# Patient Record
Sex: Male | Born: 1935 | Race: White | Hispanic: No | Marital: Married | Smoking: Former smoker
Health system: Southern US, Community
[De-identification: ages and names within clinical notes are randomized; demographics above are authoritative.]

## PROBLEM LIST (undated history)

## (undated) DIAGNOSIS — I272 Pulmonary hypertension, unspecified: Secondary | ICD-10-CM

## (undated) DIAGNOSIS — Z8679 Personal history of other diseases of the circulatory system: Secondary | ICD-10-CM

## (undated) DIAGNOSIS — F0390 Unspecified dementia without behavioral disturbance: Secondary | ICD-10-CM

## (undated) DIAGNOSIS — Z9289 Personal history of other medical treatment: Secondary | ICD-10-CM

## (undated) DIAGNOSIS — I495 Sick sinus syndrome: Secondary | ICD-10-CM

## (undated) DIAGNOSIS — E785 Hyperlipidemia, unspecified: Secondary | ICD-10-CM

## (undated) DIAGNOSIS — I951 Orthostatic hypotension: Secondary | ICD-10-CM

## (undated) DIAGNOSIS — R55 Syncope and collapse: Secondary | ICD-10-CM

## (undated) DIAGNOSIS — Z9989 Dependence on other enabling machines and devices: Secondary | ICD-10-CM

## (undated) DIAGNOSIS — D649 Anemia, unspecified: Secondary | ICD-10-CM

## (undated) DIAGNOSIS — I4891 Unspecified atrial fibrillation: Secondary | ICD-10-CM

## (undated) DIAGNOSIS — I38 Endocarditis, valve unspecified: Secondary | ICD-10-CM

## (undated) DIAGNOSIS — I350 Nonrheumatic aortic (valve) stenosis: Secondary | ICD-10-CM

## (undated) DIAGNOSIS — I10 Essential (primary) hypertension: Secondary | ICD-10-CM

## (undated) DIAGNOSIS — G4733 Obstructive sleep apnea (adult) (pediatric): Secondary | ICD-10-CM

## (undated) DIAGNOSIS — I5032 Chronic diastolic (congestive) heart failure: Secondary | ICD-10-CM

## (undated) DIAGNOSIS — K922 Gastrointestinal hemorrhage, unspecified: Secondary | ICD-10-CM

## (undated) DIAGNOSIS — Z950 Presence of cardiac pacemaker: Secondary | ICD-10-CM

## (undated) DIAGNOSIS — I509 Heart failure, unspecified: Secondary | ICD-10-CM

## (undated) DIAGNOSIS — E782 Mixed hyperlipidemia: Secondary | ICD-10-CM

## (undated) HISTORY — DX: Hyperlipidemia, unspecified: E78.5

## (undated) HISTORY — DX: Anemia, unspecified: D64.9

## (undated) HISTORY — DX: Mixed hyperlipidemia: E78.2

## (undated) HISTORY — DX: Pulmonary hypertension, unspecified: I27.20

## (undated) HISTORY — DX: Orthostatic hypotension: I95.1

## (undated) HISTORY — DX: Unspecified dementia, unspecified severity, without behavioral disturbance, psychotic disturbance, mood disturbance, and anxiety: F03.90

## (undated) HISTORY — DX: Sick sinus syndrome: I49.5

## (undated) HISTORY — DX: Obstructive sleep apnea (adult) (pediatric): G47.33

## (undated) HISTORY — DX: Unspecified atrial fibrillation: I48.91

## (undated) HISTORY — DX: Personal history of other diseases of the circulatory system: Z86.79

## (undated) HISTORY — DX: Nonrheumatic aortic (valve) stenosis: I35.0

## (undated) HISTORY — DX: Dependence on other enabling machines and devices: Z99.89

## (undated) HISTORY — DX: Personal history of other medical treatment: Z92.89

## (undated) HISTORY — DX: Gastrointestinal hemorrhage, unspecified: K92.2

## (undated) HISTORY — DX: Chronic diastolic (congestive) heart failure: I50.32

## (undated) HISTORY — DX: Endocarditis, valve unspecified: I38

## (undated) HISTORY — DX: Presence of cardiac pacemaker: Z95.0

---

## 2004-06-20 ENCOUNTER — Encounter: Payer: Self-pay | Admitting: Cardiology

## 2004-07-22 ENCOUNTER — Encounter: Payer: Self-pay | Admitting: Cardiology

## 2004-08-13 ENCOUNTER — Ambulatory Visit: Payer: Self-pay | Admitting: Cardiology

## 2004-09-11 ENCOUNTER — Ambulatory Visit: Payer: Self-pay | Admitting: Cardiology

## 2004-09-24 ENCOUNTER — Ambulatory Visit: Payer: Self-pay | Admitting: Physician Assistant

## 2004-10-09 ENCOUNTER — Ambulatory Visit: Payer: Self-pay | Admitting: Cardiology

## 2004-11-06 ENCOUNTER — Ambulatory Visit: Payer: Self-pay | Admitting: Cardiology

## 2004-11-13 ENCOUNTER — Ambulatory Visit: Payer: Self-pay | Admitting: Cardiology

## 2004-12-11 ENCOUNTER — Ambulatory Visit: Payer: Self-pay | Admitting: Cardiology

## 2004-12-18 ENCOUNTER — Ambulatory Visit: Payer: Self-pay | Admitting: Cardiology

## 2005-01-08 ENCOUNTER — Ambulatory Visit: Payer: Self-pay | Admitting: Internal Medicine

## 2005-02-05 ENCOUNTER — Ambulatory Visit: Payer: Self-pay | Admitting: Cardiology

## 2005-03-08 ENCOUNTER — Ambulatory Visit: Payer: Self-pay | Admitting: Cardiology

## 2005-03-15 ENCOUNTER — Ambulatory Visit: Payer: Self-pay | Admitting: Cardiology

## 2005-03-29 ENCOUNTER — Ambulatory Visit: Payer: Self-pay | Admitting: Internal Medicine

## 2005-04-05 ENCOUNTER — Ambulatory Visit: Payer: Self-pay | Admitting: Cardiology

## 2005-04-22 ENCOUNTER — Ambulatory Visit: Payer: Self-pay | Admitting: Cardiology

## 2005-04-29 ENCOUNTER — Ambulatory Visit: Payer: Self-pay | Admitting: Cardiology

## 2005-05-06 ENCOUNTER — Ambulatory Visit: Payer: Self-pay | Admitting: Cardiology

## 2005-05-20 ENCOUNTER — Ambulatory Visit: Payer: Self-pay | Admitting: Cardiology

## 2005-06-17 ENCOUNTER — Ambulatory Visit: Payer: Self-pay | Admitting: Cardiology

## 2005-07-15 ENCOUNTER — Ambulatory Visit: Payer: Self-pay | Admitting: Cardiology

## 2005-07-23 ENCOUNTER — Ambulatory Visit: Payer: Self-pay | Admitting: Cardiology

## 2005-07-30 ENCOUNTER — Ambulatory Visit: Payer: Self-pay | Admitting: Cardiology

## 2005-08-06 ENCOUNTER — Ambulatory Visit: Payer: Self-pay | Admitting: Cardiology

## 2005-08-14 ENCOUNTER — Ambulatory Visit: Payer: Self-pay | Admitting: Cardiology

## 2005-08-26 ENCOUNTER — Ambulatory Visit: Payer: Self-pay | Admitting: Cardiology

## 2005-09-02 ENCOUNTER — Ambulatory Visit: Payer: Self-pay | Admitting: Internal Medicine

## 2005-09-24 ENCOUNTER — Ambulatory Visit: Payer: Self-pay | Admitting: Cardiology

## 2005-10-01 ENCOUNTER — Ambulatory Visit: Payer: Self-pay | Admitting: Cardiology

## 2005-10-08 ENCOUNTER — Ambulatory Visit: Payer: Self-pay | Admitting: Cardiology

## 2005-10-15 ENCOUNTER — Ambulatory Visit: Payer: Self-pay | Admitting: Cardiology

## 2005-10-18 ENCOUNTER — Ambulatory Visit: Payer: Self-pay | Admitting: Cardiology

## 2005-10-22 ENCOUNTER — Ambulatory Visit: Payer: Self-pay

## 2005-11-15 ENCOUNTER — Ambulatory Visit: Payer: Self-pay | Admitting: Cardiology

## 2005-11-22 ENCOUNTER — Ambulatory Visit: Payer: Self-pay | Admitting: Cardiology

## 2005-11-29 ENCOUNTER — Ambulatory Visit: Payer: Self-pay | Admitting: Cardiology

## 2005-12-06 ENCOUNTER — Ambulatory Visit: Payer: Self-pay | Admitting: Cardiology

## 2005-12-20 ENCOUNTER — Ambulatory Visit: Payer: Self-pay | Admitting: Cardiology

## 2006-01-23 ENCOUNTER — Ambulatory Visit: Payer: Self-pay | Admitting: Cardiology

## 2006-02-20 ENCOUNTER — Ambulatory Visit: Payer: Self-pay | Admitting: Cardiology

## 2006-02-27 ENCOUNTER — Ambulatory Visit: Payer: Self-pay | Admitting: Cardiology

## 2006-04-03 ENCOUNTER — Ambulatory Visit: Payer: Self-pay | Admitting: Cardiology

## 2006-04-17 ENCOUNTER — Ambulatory Visit: Payer: Self-pay | Admitting: Cardiology

## 2006-05-01 ENCOUNTER — Ambulatory Visit: Payer: Self-pay | Admitting: Cardiology

## 2006-05-15 ENCOUNTER — Ambulatory Visit: Payer: Self-pay | Admitting: Cardiology

## 2006-06-05 ENCOUNTER — Ambulatory Visit: Payer: Self-pay | Admitting: Cardiology

## 2006-06-12 ENCOUNTER — Ambulatory Visit: Payer: Self-pay | Admitting: Cardiology

## 2006-06-19 ENCOUNTER — Ambulatory Visit: Payer: Self-pay | Admitting: Cardiology

## 2006-07-24 ENCOUNTER — Ambulatory Visit: Payer: Self-pay | Admitting: Cardiology

## 2006-08-26 ENCOUNTER — Ambulatory Visit: Payer: Self-pay | Admitting: Cardiology

## 2006-09-16 ENCOUNTER — Ambulatory Visit: Payer: Self-pay | Admitting: Cardiology

## 2006-10-17 ENCOUNTER — Ambulatory Visit: Payer: Self-pay | Admitting: Cardiology

## 2006-11-14 ENCOUNTER — Ambulatory Visit: Payer: Self-pay | Admitting: Cardiology

## 2006-11-20 ENCOUNTER — Ambulatory Visit (HOSPITAL_COMMUNITY): Admission: RE | Admit: 2006-11-20 | Discharge: 2006-11-20 | Payer: Self-pay | Admitting: *Deleted

## 2006-11-21 ENCOUNTER — Ambulatory Visit: Payer: Self-pay | Admitting: Cardiology

## 2006-12-04 ENCOUNTER — Ambulatory Visit: Payer: Self-pay | Admitting: Cardiology

## 2006-12-19 ENCOUNTER — Ambulatory Visit: Payer: Self-pay | Admitting: Cardiology

## 2006-12-29 ENCOUNTER — Ambulatory Visit: Payer: Self-pay | Admitting: Cardiology

## 2007-01-12 ENCOUNTER — Ambulatory Visit: Payer: Self-pay | Admitting: Cardiology

## 2007-02-11 ENCOUNTER — Ambulatory Visit: Payer: Self-pay | Admitting: Cardiology

## 2007-03-02 ENCOUNTER — Ambulatory Visit: Payer: Self-pay | Admitting: Cardiology

## 2007-03-24 ENCOUNTER — Ambulatory Visit: Payer: Self-pay | Admitting: Cardiology

## 2007-03-26 ENCOUNTER — Ambulatory Visit (HOSPITAL_COMMUNITY): Admission: AD | Admit: 2007-03-26 | Discharge: 2007-03-27 | Payer: Self-pay | Admitting: Internal Medicine

## 2007-03-26 ENCOUNTER — Ambulatory Visit: Payer: Self-pay | Admitting: Internal Medicine

## 2007-03-26 HISTORY — PX: OTHER SURGICAL HISTORY: SHX169

## 2007-04-13 ENCOUNTER — Ambulatory Visit: Payer: Self-pay

## 2007-04-27 ENCOUNTER — Ambulatory Visit: Payer: Self-pay | Admitting: Cardiology

## 2007-04-29 ENCOUNTER — Ambulatory Visit: Admission: RE | Admit: 2007-04-29 | Discharge: 2007-04-29 | Payer: Self-pay | Admitting: Pulmonary Disease

## 2007-04-29 ENCOUNTER — Ambulatory Visit: Payer: Self-pay | Admitting: Pulmonary Disease

## 2007-05-04 ENCOUNTER — Ambulatory Visit: Payer: Self-pay | Admitting: Cardiology

## 2007-05-20 ENCOUNTER — Ambulatory Visit: Payer: Self-pay | Admitting: Pulmonary Disease

## 2007-06-05 ENCOUNTER — Ambulatory Visit: Payer: Self-pay | Admitting: Cardiology

## 2007-06-26 ENCOUNTER — Ambulatory Visit: Payer: Self-pay | Admitting: Cardiology

## 2007-07-02 ENCOUNTER — Ambulatory Visit: Payer: Self-pay | Admitting: Cardiology

## 2007-07-09 ENCOUNTER — Ambulatory Visit: Payer: Self-pay | Admitting: Cardiology

## 2007-07-23 ENCOUNTER — Ambulatory Visit: Payer: Self-pay | Admitting: Cardiology

## 2007-07-27 ENCOUNTER — Ambulatory Visit: Payer: Self-pay | Admitting: Internal Medicine

## 2007-08-20 ENCOUNTER — Ambulatory Visit: Payer: Self-pay | Admitting: Cardiology

## 2007-09-02 ENCOUNTER — Ambulatory Visit: Payer: Self-pay | Admitting: Cardiology

## 2007-09-16 ENCOUNTER — Ambulatory Visit: Payer: Self-pay | Admitting: Cardiology

## 2007-10-07 ENCOUNTER — Ambulatory Visit: Payer: Self-pay | Admitting: Cardiology

## 2007-10-21 ENCOUNTER — Encounter: Payer: Self-pay | Admitting: Cardiology

## 2007-11-17 ENCOUNTER — Ambulatory Visit: Payer: Self-pay | Admitting: Cardiology

## 2007-11-23 ENCOUNTER — Ambulatory Visit: Payer: Self-pay | Admitting: Cardiology

## 2008-03-24 ENCOUNTER — Ambulatory Visit: Payer: Self-pay | Admitting: Internal Medicine

## 2008-05-11 ENCOUNTER — Encounter: Payer: Self-pay | Admitting: Physician Assistant

## 2008-05-11 ENCOUNTER — Ambulatory Visit: Payer: Self-pay | Admitting: Cardiology

## 2008-09-16 ENCOUNTER — Ambulatory Visit: Payer: Self-pay | Admitting: Internal Medicine

## 2008-10-31 ENCOUNTER — Encounter: Payer: Self-pay | Admitting: Cardiology

## 2008-11-29 ENCOUNTER — Encounter: Payer: Self-pay | Admitting: Cardiology

## 2008-11-30 ENCOUNTER — Encounter: Payer: Self-pay | Admitting: Cardiology

## 2009-01-20 ENCOUNTER — Encounter (INDEPENDENT_AMBULATORY_CARE_PROVIDER_SITE_OTHER): Payer: Self-pay

## 2009-01-29 IMAGING — CR DG CHEST 2V
2 series · 2 of 2 positions shown · non-contrast
Comparison: None.

CLINICAL DATA: Chest pain ? post pacer placement.
 CHEST ? 2 VIEW:

[w chest pa]
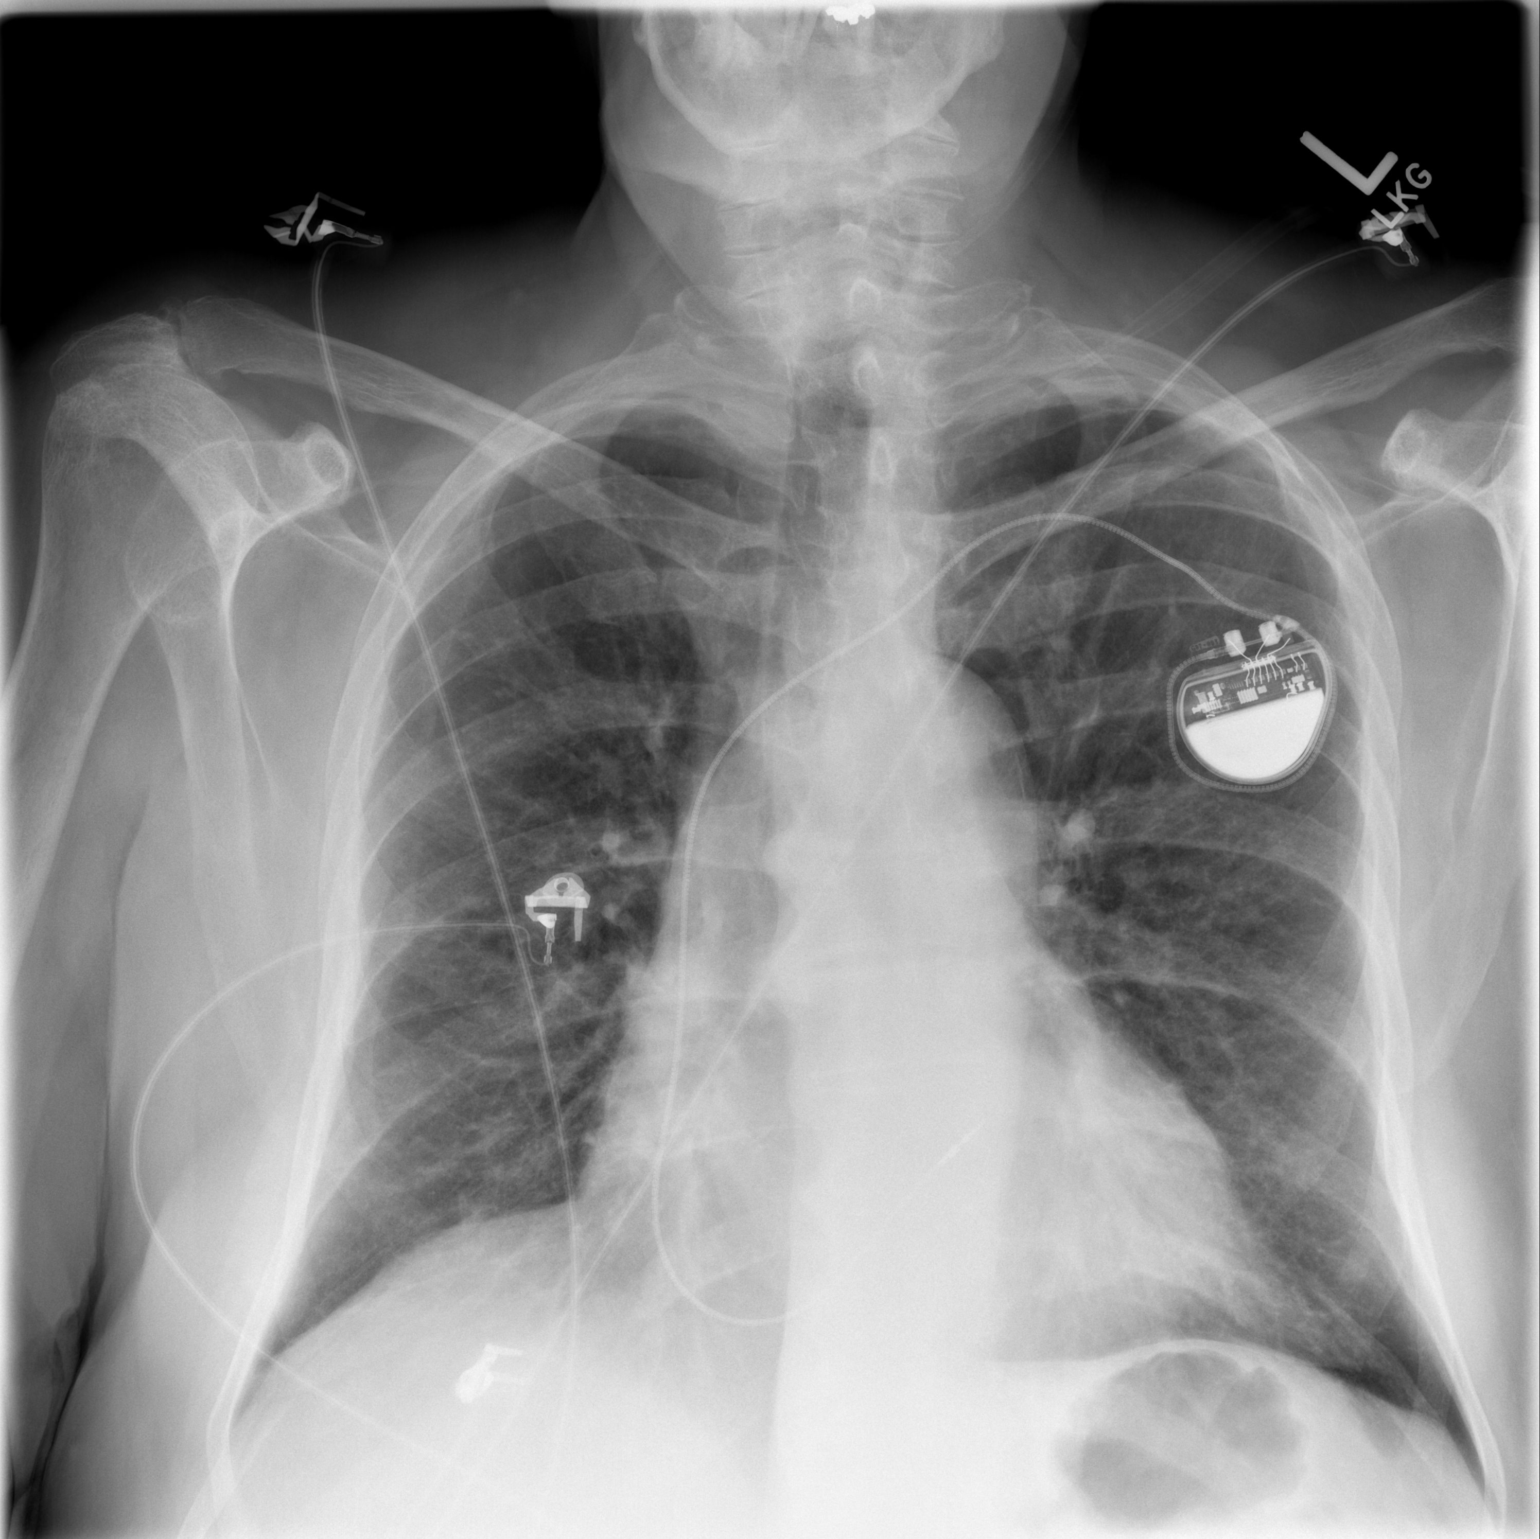

[w chest lat]
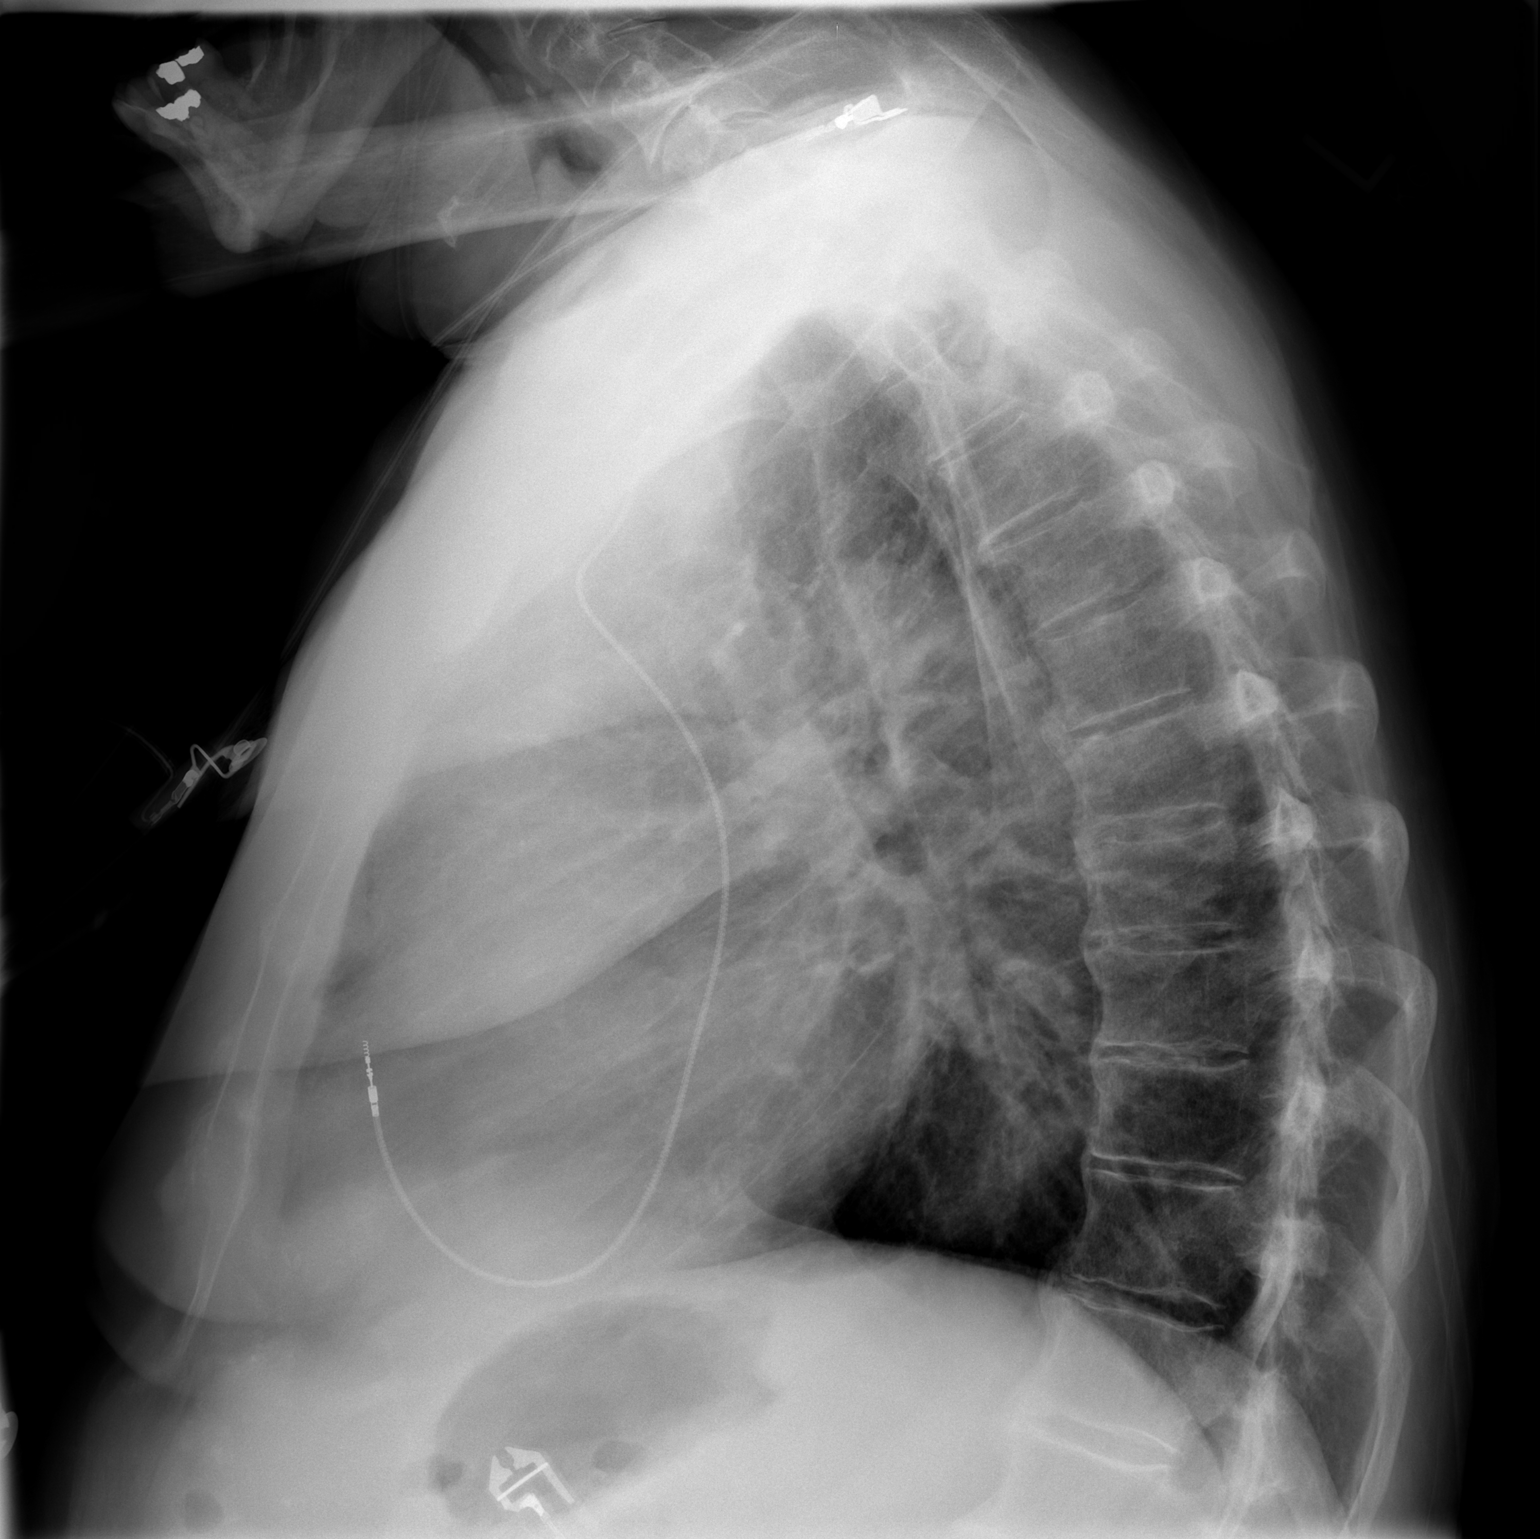

[2 of 2 positions shown; findings below may reference images not displayed]

FINDINGS: The heart is mildly enlarged.  There is no congestive heart failure.  A single lead pacemaker has been placed from the left subclavian vein to the right atrium.  No pneumothorax.  Lungs are mildly hyperaerated but clear.  Prominent mild peribronchial thickening noted.
IMPRESSION: 1. Status post placement of permanent cardiac pacer, as above ? no pneumothorax.  
 2. Mild cardiomegaly without congestive heart failure.  
 3. Chronic lung changes ? no active disease.

## 2009-01-31 ENCOUNTER — Ambulatory Visit: Payer: Self-pay | Admitting: Cardiology

## 2009-04-29 ENCOUNTER — Encounter: Payer: Self-pay | Admitting: Cardiology

## 2009-05-30 DIAGNOSIS — I350 Nonrheumatic aortic (valve) stenosis: Secondary | ICD-10-CM

## 2009-05-30 DIAGNOSIS — E785 Hyperlipidemia, unspecified: Secondary | ICD-10-CM | POA: Insufficient documentation

## 2009-05-30 DIAGNOSIS — I495 Sick sinus syndrome: Secondary | ICD-10-CM

## 2009-05-30 DIAGNOSIS — I5032 Chronic diastolic (congestive) heart failure: Secondary | ICD-10-CM | POA: Insufficient documentation

## 2009-05-30 DIAGNOSIS — I4891 Unspecified atrial fibrillation: Secondary | ICD-10-CM

## 2009-06-02 ENCOUNTER — Ambulatory Visit: Payer: Self-pay | Admitting: Internal Medicine

## 2009-06-02 DIAGNOSIS — Z950 Presence of cardiac pacemaker: Secondary | ICD-10-CM

## 2009-06-16 ENCOUNTER — Encounter: Payer: Self-pay | Admitting: Internal Medicine

## 2009-08-15 ENCOUNTER — Ambulatory Visit: Payer: Self-pay | Admitting: Cardiology

## 2009-08-15 DIAGNOSIS — I2789 Other specified pulmonary heart diseases: Secondary | ICD-10-CM

## 2009-08-15 DIAGNOSIS — D649 Anemia, unspecified: Secondary | ICD-10-CM

## 2009-08-15 DIAGNOSIS — G4733 Obstructive sleep apnea (adult) (pediatric): Secondary | ICD-10-CM

## 2009-08-15 DIAGNOSIS — D72829 Elevated white blood cell count, unspecified: Secondary | ICD-10-CM | POA: Insufficient documentation

## 2009-08-23 ENCOUNTER — Encounter: Payer: Self-pay | Admitting: Cardiology

## 2009-08-23 ENCOUNTER — Ambulatory Visit: Payer: Self-pay | Admitting: Cardiology

## 2009-08-24 ENCOUNTER — Encounter: Payer: Self-pay | Admitting: Cardiology

## 2009-08-28 ENCOUNTER — Telehealth (INDEPENDENT_AMBULATORY_CARE_PROVIDER_SITE_OTHER): Payer: Self-pay | Admitting: *Deleted

## 2009-08-28 ENCOUNTER — Ambulatory Visit: Payer: Self-pay | Admitting: Cardiology

## 2009-08-28 ENCOUNTER — Encounter: Payer: Self-pay | Admitting: Cardiology

## 2009-08-31 ENCOUNTER — Encounter: Payer: Self-pay | Admitting: Cardiology

## 2009-09-04 ENCOUNTER — Telehealth (INDEPENDENT_AMBULATORY_CARE_PROVIDER_SITE_OTHER): Payer: Self-pay | Admitting: *Deleted

## 2009-09-04 ENCOUNTER — Encounter: Payer: Self-pay | Admitting: Cardiology

## 2009-09-05 ENCOUNTER — Encounter: Payer: Self-pay | Admitting: Cardiology

## 2009-09-05 ENCOUNTER — Ambulatory Visit: Payer: Self-pay | Admitting: Cardiology

## 2009-09-07 ENCOUNTER — Encounter: Payer: Self-pay | Admitting: Cardiology

## 2009-09-15 ENCOUNTER — Ambulatory Visit: Payer: Self-pay | Admitting: Internal Medicine

## 2009-09-18 ENCOUNTER — Ambulatory Visit: Payer: Self-pay | Admitting: Cardiology

## 2009-09-18 DIAGNOSIS — M109 Gout, unspecified: Secondary | ICD-10-CM

## 2009-11-20 ENCOUNTER — Ambulatory Visit: Payer: Self-pay | Admitting: Cardiology

## 2009-11-20 DIAGNOSIS — R0602 Shortness of breath: Secondary | ICD-10-CM | POA: Insufficient documentation

## 2009-11-20 DIAGNOSIS — R42 Dizziness and giddiness: Secondary | ICD-10-CM

## 2009-11-27 ENCOUNTER — Encounter: Payer: Self-pay | Admitting: Cardiology

## 2009-12-01 ENCOUNTER — Encounter (INDEPENDENT_AMBULATORY_CARE_PROVIDER_SITE_OTHER): Payer: Self-pay | Admitting: *Deleted

## 2009-12-16 ENCOUNTER — Ambulatory Visit: Payer: Self-pay | Admitting: Internal Medicine

## 2010-03-19 ENCOUNTER — Encounter: Payer: Self-pay | Admitting: Internal Medicine

## 2010-03-19 ENCOUNTER — Ambulatory Visit: Payer: Self-pay | Admitting: Internal Medicine

## 2010-06-18 ENCOUNTER — Ambulatory Visit: Payer: Self-pay | Admitting: Internal Medicine

## 2010-07-30 ENCOUNTER — Encounter: Payer: Self-pay | Admitting: Cardiology

## 2010-08-16 ENCOUNTER — Ambulatory Visit: Payer: Self-pay | Admitting: Cardiology

## 2010-08-29 ENCOUNTER — Encounter: Payer: Self-pay | Admitting: Cardiology

## 2010-09-04 ENCOUNTER — Ambulatory Visit: Payer: Self-pay | Admitting: Cardiology

## 2010-09-06 DIAGNOSIS — Z8679 Personal history of other diseases of the circulatory system: Secondary | ICD-10-CM

## 2010-09-06 HISTORY — DX: Personal history of other diseases of the circulatory system: Z86.79

## 2010-09-11 ENCOUNTER — Encounter: Payer: Self-pay | Admitting: Cardiology

## 2010-09-11 ENCOUNTER — Encounter: Payer: Self-pay | Admitting: Physician Assistant

## 2010-09-12 ENCOUNTER — Encounter: Payer: Self-pay | Admitting: Physician Assistant

## 2010-09-14 ENCOUNTER — Encounter: Payer: Self-pay | Admitting: Cardiology

## 2010-09-17 ENCOUNTER — Ambulatory Visit: Payer: Self-pay | Admitting: Internal Medicine

## 2010-10-16 ENCOUNTER — Ambulatory Visit
Admission: RE | Admit: 2010-10-16 | Discharge: 2010-10-16 | Payer: Self-pay | Source: Home / Self Care | Attending: Physician Assistant | Admitting: Physician Assistant

## 2010-10-16 ENCOUNTER — Encounter: Payer: Self-pay | Admitting: Physician Assistant

## 2010-10-17 ENCOUNTER — Encounter: Payer: Self-pay | Admitting: Physician Assistant

## 2010-10-19 ENCOUNTER — Telehealth (INDEPENDENT_AMBULATORY_CARE_PROVIDER_SITE_OTHER): Payer: Self-pay | Admitting: *Deleted

## 2010-10-23 ENCOUNTER — Encounter: Payer: Self-pay | Admitting: Cardiology

## 2010-10-26 ENCOUNTER — Encounter: Payer: Self-pay | Admitting: Cardiology

## 2010-10-26 ENCOUNTER — Telehealth (INDEPENDENT_AMBULATORY_CARE_PROVIDER_SITE_OTHER): Payer: Self-pay | Admitting: *Deleted

## 2010-10-27 ENCOUNTER — Encounter: Payer: Self-pay | Admitting: Cardiology

## 2010-10-29 ENCOUNTER — Ambulatory Visit
Admission: RE | Admit: 2010-10-29 | Discharge: 2010-10-29 | Payer: Self-pay | Source: Home / Self Care | Attending: Internal Medicine | Admitting: Internal Medicine

## 2010-10-29 ENCOUNTER — Encounter: Payer: Self-pay | Admitting: Internal Medicine

## 2010-11-06 NOTE — Cardiovascular Report (Signed)
Summary: TTM  TTM   Imported By: Roderic Ovens 12/27/2009 16:16:35  _____________________________________________________________________  External Attachment:    Type:   Image     Comment:   External Document

## 2010-11-06 NOTE — Letter (Signed)
Summary: Engineer, materials at [facility]  [address]   [place], [place] [address]   Phone: [phone]  Fax: [phone]        December 01, 2009 MRN: [id number]   JARRED PURTEE [address] [place], [place]  [address]   Dear Mr. MORRONE,  Your test ordered by Selena Batten has been reviewed by your physician (or physician assistant) and was found to be normal or stable. Your physician (or physician assistant) felt no changes were needed at this time.  ____ Echocardiogram  ____ Cardiac Stress Test  __X__ Lab Work (CBC & renal function stable)  ____ Peripheral vascular study of arms, legs or neck  ____ CT scan or X-ray  ____ Lung or Breathing test  ____ Other:   Thank you.   Hoover Brunette, LPN    Duane Boston, M.D., F.A.C.C. Thressa Sheller, M.D., F.A.C.C. Oneal Grout, M.D., F.A.C.C. Cheree Ditto, M.D., F.A.C.C. Daiva Nakayama, M.D., F.A.C.C. Kenney Houseman, M.D., F.A.C.C. Jeanne Ivan, PA-C

## 2010-11-06 NOTE — Assessment & Plan Note (Signed)
Summary: 2 MO FU -SRS  Medications Added LORAZEPAM 0.5 MG TABS (LORAZEPAM) Take 1 tablet by mouth two times a day as needed      Allergies Added:   Primary Provider:  Charm Barges  CC:  follow-up visit.  History of Present Illness: the patient is an 77 year old male with a history of chronic diastolic heart failure, severe concentric left ventricular hypertrophy and moderate aortic stenosis. The patient has moderate coronary artery disease with a patent stent in the mid RCA in 2008. In 2009 he had a normal Cardiolite stress study. The patient has been doing well since his last hospitalization. He is now on a regimen with torsemide and metolazone and able to give his weight and fluid status down. The patient's wife does note that his blood pressure sometimes runs low which is associated with some dizziness. The patient remains on oxygen therapy. His NYHA class III. He denies any chest pain.the patient is in permanent atrial fibrillation but rate controlled is also status post pacemaker implantation.  Clinical Review Panels:  CXR CXR results cardiomegaly increased vascular congestion and possible mild edema. No focal airspace disease. (05/01/2009)  Echocardiogram Echocardiogram the normal chamber size. moderate to severe concentric left ventricle hypertrophy EF 70% left atrium mildly dilated right ventricle was mildly dilated right ventricle systolic dysfunction, mild pacemaker wire visualized moderate right atrial enlargement moderate aortic stenosis mild aortic regurgitation moderate tricuspid regurgitation pacemaker wire present. (08/23/2009)    Preventive Screening-Counseling & Management  Alcohol-Tobacco     Smoking Status: quit     Year Quit: 1970  Current Medications (verified): 1)  Levemir Flexpen 100 Unit/ml Soln (Insulin Detemir) .... As Directed 2)  Gabapentin 300 Mg Caps (Gabapentin) .... At Bedtime 3)  Aricept 10 Mg Tabs (Donepezil Hcl) .... Once Daily 4)   Potassium Chloride Crys Cr 20 Meq Cr-Tabs (Potassium Chloride Crys Cr) .... Take One Tablet By Mouth Twice A Day 5)  Namenda 10 Mg Tabs (Memantine Hcl) .... Two Times A Day 6)  Carbidopa-Levodopa 10-100 Mg Tabs (Carbidopa-Levodopa) .... Two Times A Day 7)  Calcium Carbonate-Vitamin D 600-400 Mg-Unit  Tabs (Calcium Carbonate-Vitamin D) .... Once Daily 8)  Simvastatin 40 Mg Tabs (Simvastatin) .... Take 1/2 Tablet By Mouth Daily At Bedtime 9)  Aspirin 81 Mg Tbec (Aspirin) .... Take One Tablet By Mouth Daily 10)  Promethazine Hcl 25 Mg Tabs (Promethazine Hcl) .... As Needed 11)  Albuterol Sulfate (2.5 Mg/62ml) 0.083% Nebu (Albuterol Sulfate) .... As Needed 12)  Verapamil Hcl Cr 240 Mg Cr-Tabs (Verapamil Hcl) .... Take 1 Tablet By Mouth Two Times A Day 13)  Torsemide 20 Mg Tabs (Torsemide) .... Take 3 Tablet By Mouth Once A Day 14)  Metolazone 2.5 Mg Tabs (Metolazone) .... Take 1 Tablet By Mouth Once A Day As Needed If Weight >3lbs 15)  Glipizide 5 Mg Tabs (Glipizide) .... Take 1 Tablet By Mouth Two Times A Day 16)  Carvedilol 6.25 Mg Tabs (Carvedilol) .... Take 1 Tablet By Mouth Two Times A Day 17)  Colchicine 0.6 Mg Tabs (Colchicine) .... Take 1 Tablet By Mouth Once A Day 18)  Lorazepam 0.5 Mg Tabs (Lorazepam) .... Take 1 Tablet By Mouth Two Times A Day As Needed  Allergies (verified): 1)  ! Pcn 2)  ! Allopurinol  Comments:  Nurse/Medical Assistant: The patient's medications and allergies were reviewed with the patient and were updated in the Medication and Allergy Lists. List reviewed.  Past History:  Past Medical History: Last updated: 08/15/2009 Current Problems:  AORTIC  STENOSIS, MODERATE (ICD-424.1) DIASTOLIC HEART FAILURE, CHRONIC (ICD-428.32) HYPERLIPIDEMIA-MIXED (ICD-272.4) SICK SINUS/ TACHY-BRADY SYNDROME (ICD-427.81) ATRIAL FIBRILLATION (ICD-427.31) Dementia Insulin-dependent diabetes mellitus Severe pulmonary hypertension 1. Permanent atrial fibrillation, off  Coumadin. 2. Tachy-brady syndrome status post Guidant dual-chamber pacemaker. 3. Preserved LV function. 4. Valvular heart disease with moderate aortic stenosis. 5. Severe pulmonary hypertension, oxygen dependent. 6. Chronic anemia followed by Dr. Charm Barges.hemoglobin 9.9 July of 2010, white count 15.2 7. Insulin-dependent diabetes mellitus. 8. Dyslipidemia. 9. Dementia. 10.The patient has chronic diastolic heart failure. Sleep apnea on CPAP.  Past Surgical History: Last updated: 05/30/2009 PCM - 03/26/07 -  Guidant INSIGNIA-1  - Tachybrady syndrome & A FIb  Family History: Last updated: 08/15/2009 noncontributory  Social History: Last updated: 05/30/2009 Retired  Married  Tobacco Use - No.  Alcohol Use - no Drug Use - no  Risk Factors: Smoking Status: quit (11/20/2009)  Review of Systems       The patient complains of fatigue and shortness of breath.  The patient denies malaise, fever, weight gain/loss, vision loss, decreased hearing, hoarseness, chest pain, palpitations, prolonged cough, wheezing, sleep apnea, coughing up blood, abdominal pain, blood in stool, nausea, vomiting, diarrhea, heartburn, incontinence, blood in urine, muscle weakness, joint pain, leg swelling, rash, skin lesions, headache, fainting, dizziness, depression, anxiety, enlarged lymph nodes, easy bruising or bleeding, and environmental allergies.    Vital Signs:  Patient profile:   77 year old male Height:      73 inches Weight:      219 pounds O2 Sat:      98 % on 2 L/min via [place] Pulse rate:   65 / minute BP sitting:   94 / 57  (left arm) Cuff size:   large  Vitals Entered By: Carlye Grippe (November 20, 2009 10:44 AM)  O2 Flow:  2 L/min via  CC: follow-up visit   Physical Exam  Additional Exam:  General: Well-developed, well-nourished in no distress, patient is on oxygen therapy. head: Normocephalic and atraumatic eyes PERRLA/EOMI intact, conjunctiva and lids normal nose: No deformity or  lesions mouth normal dentition, normal posterior pharynx neck: Supple, no JVD.  No masses, thyromegaly or abnormal cervical nodes. No carotid bruits lungs: Normal breath sounds bilaterally without wheezing.  Normal percussion heart: regular rate and rhythm with normal S1 and S2, no S3 or S4.  PMI is normal.  No pathological murmurs abdomen: Normal bowel sounds, abdomen is soft and nontender without masses, organomegaly or hernias noted.  No hepatosplenomegaly musculoskeletal: Back normal, patient walks with a walker. pulsus: Pulse is normal in all 4 extremities Extremities: No peripheral pitting edema neurologic: Alert and oriented x 3 skin: Intact without lesions or rashes cervical nodes: No significant adenopathy psychologic: Normal affect    PPM Specifications Following MD:  Sherryl Manges, MD     PPM Vendor:  [facility] [employer]     PPM Model Number:  [id number]     PPM Serial Number:  [id number] PPM DOI:  03/26/2007     PPM Implanting MD:  Sherryl Manges, MD  Lead 1    Location: RV     DOI: 03/26/2007     Model #: [id number]     Serial #: [id number]     Status: active   Indications:  Tachy-Brady Syndrome   PPM Follow Up Pacer Dependent:  Yes      Parameters Mode:  SSIR     Lower Rate Limit:  60     Upper Rate Limit:  120  Impression & Recommendations:  Problem #  1:  ATRIAL FIBRILLATION (ICD-427.31) the patient is in permanent atrial fibrillation. His heart rate is controlled. The patient has a history of chronic anemia and has been taken off Coumadin. His updated medication list for this problem includes:    Aspirin 81 Mg Tbec (Aspirin) .Marland Kitchen... Take one tablet by mouth daily    Carvedilol 6.25 Mg Tabs (Carvedilol) .Marland Kitchen... Take 1 tablet by mouth two times a day  Orders: T-CBC No Diff ([id number]) T-Basic Metabolic Panel ([id number])  Problem # 2:  SHORTNESS OF BREATH (ICD-786.05) patient's significant underlying lung disease as well as diastolic heart failure. He has no evidence of volume  overload and his dyspnea is stable and improved. The following medications were removed from the medication list:    Lisinopril 10 Mg Tabs (Lisinopril) .Marland Kitchen... Take one tablet by mouth at bedtime His updated medication list for this problem includes:    Aspirin 81 Mg Tbec (Aspirin) .Marland Kitchen... Take one tablet by mouth daily    Verapamil Hcl Cr 240 Mg Cr-tabs (Verapamil hcl) .Marland Kitchen... Take 1 tablet by mouth two times a day    Torsemide 20 Mg Tabs (Torsemide) .Marland Kitchen... Take 3 tablet by mouth once a day    Metolazone 2.5 Mg Tabs (Metolazone) .Marland Kitchen... Take 1 tablet by mouth once a day as needed if weight >3lbs    Carvedilol 6.25 Mg Tabs (Carvedilol) .Marland Kitchen... Take 1 tablet by mouth two times a day  Orders: T-CBC No Diff ([id number]) T-Basic Metabolic Panel ([id number])  Problem # 3:  DIASTOLIC HEART FAILURE, CHRONIC (ICD-428.32) the patient is well compensated. We will check laboratory work including a CBC and electrolyte panel. The following medications were removed from the medication list:    Lisinopril 10 Mg Tabs (Lisinopril) .Marland Kitchen... Take one tablet by mouth at bedtime His updated medication list for this problem includes:    Aspirin 81 Mg Tbec (Aspirin) .Marland Kitchen... Take one tablet by mouth daily    Verapamil Hcl Cr 240 Mg Cr-tabs (Verapamil hcl) .Marland Kitchen... Take 1 tablet by mouth two times a day    Torsemide 20 Mg Tabs (Torsemide) .Marland Kitchen... Take 3 tablet by mouth once a day    Metolazone 2.5 Mg Tabs (Metolazone) .Marland Kitchen... Take 1 tablet by mouth once a day as needed if weight >3lbs    Carvedilol 6.25 Mg Tabs (Carvedilol) .Marland Kitchen... Take 1 tablet by mouth two times a day  Orders: T-CBC No Diff ([id number]) T-Basic Metabolic Panel ([id number])  Problem # 4:  AORTIC STENOSIS, MODERATE (ICD-424.1) Assessment: Comment Only  The following medications were removed from the medication list:    Lisinopril 10 Mg Tabs (Lisinopril) .Marland Kitchen... Take one tablet by mouth at bedtime His updated medication list for this problem includes:     Torsemide 20 Mg Tabs (Torsemide) .Marland Kitchen... Take 3 tablet by mouth once a day    Metolazone 2.5 Mg Tabs (Metolazone) .Marland Kitchen... Take 1 tablet by mouth once a day as needed if weight >3lbs    Carvedilol 6.25 Mg Tabs (Carvedilol) .Marland Kitchen... Take 1 tablet by mouth two times a day  Problem # 5:  DIZZINESS (ICD-780.4) the patient runs a relatively low blood pressure and is associated with dizziness. I discontinued lisinopril.  Patient Instructions: 1)  Labs - can do next week 2)  Stop Lisinopril 3)  Follow up in  6 months.

## 2010-11-06 NOTE — Cardiovascular Report (Signed)
Summary: TTM   TTM   Imported By: Roderic Ovens 04/16/2010 14:47:08  _____________________________________________________________________  External Attachment:    Type:   Image     Comment:   External Document

## 2010-11-06 NOTE — Cardiovascular Report (Signed)
Summary: TTM   TTM   Imported By: Roderic Ovens 11/01/2009 10:42:52  _____________________________________________________________________  External Attachment:    Type:   Image     Comment:   External Document

## 2010-11-06 NOTE — Cardiovascular Report (Signed)
Summary: TTM   TTM   Imported By: Roderic Ovens 07/18/2010 16:10:05  _____________________________________________________________________  External Attachment:    Type:   Image     Comment:   External Document

## 2010-11-06 NOTE — Miscellaneous (Signed)
Summary: Orders Update - pulmonology  Clinical Lists Changes  Orders: Added new Referral order of Misc. Referral (Misc. Ref) - Signed

## 2010-11-06 NOTE — Miscellaneous (Signed)
Summary: Orders Update  Clinical Lists Changes  Orders: Added new Referral order of 2-D Echocardiogram (2D Echo) - Signed 

## 2010-11-06 NOTE — Assessment & Plan Note (Signed)
Summary: 6 MO FU PER AUG REMINDER-SRS      Allergies Added:   Visit Type:  Follow-up Primary Tayler Heiden:  Charm Barges   History of Present Illness: the patient is an 77 year old male with permanent atrial fibrillation.  He is currently not on Coumadin.  He has valvular heart disease with moderate aortic stenosis and severe pulmonary hypertension for which he is oxygen dependent.the patient follows up with Dr. Orson Aloe.  Despite his severe pulmonary hypertension it is doubtful that the patient is a candidate for vasodilator therapy.  He feels his breathing is stable.  He has no chest pain or palpitations.  His NYHA class 3 -- 4.  He has mild chronic renal insufficiency with a creatinine of 1.58.  He reports no syncope.  He has some pain in the right arm with movement.  Preventive Screening-Counseling & Management  Alcohol-Tobacco     Smoking Status: quit     Year Quit: 1970  Current Medications (verified): 1)  Levemir Flexpen 100 Unit/ml Soln (Insulin Detemir) .... As Directed 2)  Gabapentin 300 Mg Caps (Gabapentin) .... At Bedtime 3)  Aricept 10 Mg Tabs (Donepezil Hcl) .... Once Daily 4)  Potassium Chloride Crys Cr 20 Meq Cr-Tabs (Potassium Chloride Crys Cr) .... Take One Tablet By Mouth Twice A Day 5)  Namenda 10 Mg Tabs (Memantine Hcl) .... Two Times A Day 6)  Carbidopa-Levodopa 10-100 Mg Tabs (Carbidopa-Levodopa) .... Two Times A Day 7)  Calcium Carbonate-Vitamin D 600-400 Mg-Unit  Tabs (Calcium Carbonate-Vitamin D) .... Once Daily 8)  Simvastatin 40 Mg Tabs (Simvastatin) .... Take 1/2 Tablet By Mouth Daily At Bedtime 9)  Aspirin 81 Mg Tbec (Aspirin) .... Take One Tablet By Mouth Daily 10)  Promethazine Hcl 25 Mg Tabs (Promethazine Hcl) .... As Needed 11)  Albuterol Sulfate (2.5 Mg/73ml) 0.083% Nebu (Albuterol Sulfate) .... As Needed 12)  Verapamil Hcl Cr 240 Mg Cr-Tabs (Verapamil Hcl) .... Take 1 Tablet By Mouth Two Times A Day 13)  Torsemide 20 Mg Tabs (Torsemide) .... Take 3 Tablet By  Mouth Once A Day 14)  Metolazone 2.5 Mg Tabs (Metolazone) .... Take 1 Tablet By Mouth Once A Day As Needed If Weight >3lbs 15)  Glipizide 5 Mg Tabs (Glipizide) .... Take 1 Tablet By Mouth Two Times A Day 16)  Carvedilol 6.25 Mg Tabs (Carvedilol) .... Take 1 Tablet By Mouth Two Times A Day 17)  Colchicine 0.6 Mg Tabs (Colchicine) .... Take 1 Tablet By Mouth Once A Day 18)  Lorazepam 0.5 Mg Tabs (Lorazepam) .... Take 1 Tablet By Mouth Two Times A Day As Needed  Allergies (verified): 1)  ! Pcn 2)  ! Allopurinol  Comments:  Nurse/Medical Assistant: The patient's medication list and allergies were reviewed with the patient and were updated in the Medication and Allergy Lists.  Past History:  Past Medical History: Last updated: 08/15/2009 Current Problems:  AORTIC STENOSIS, MODERATE (ICD-424.1) DIASTOLIC HEART FAILURE, CHRONIC (ICD-428.32) HYPERLIPIDEMIA-MIXED (ICD-272.4) SICK SINUS/ TACHY-BRADY SYNDROME (ICD-427.81) ATRIAL FIBRILLATION (ICD-427.31) Dementia Insulin-dependent diabetes mellitus Severe pulmonary hypertension 1. Permanent atrial fibrillation, off Coumadin. 2. Tachy-brady syndrome status post Guidant dual-chamber pacemaker. 3. Preserved LV function. 4. Valvular heart disease with moderate aortic stenosis. 5. Severe pulmonary hypertension, oxygen dependent. 6. Chronic anemia followed by Dr. Charm Barges.hemoglobin 9.9 July of 2010, white count 15.2 7. Insulin-dependent diabetes mellitus. 8. Dyslipidemia. 9. Dementia. 10.The patient has chronic diastolic heart failure. Sleep apnea on CPAP.  Past Surgical History: Last updated: 05/30/2009 PCM - 03/26/07 -  Guidant INSIGNIA-1  - Tachybrady  syndrome & A FIb  Family History: Last updated: 08/15/2009 noncontributory  Social History: Last updated: 05/30/2009 Retired  Married  Tobacco Use - No.  Alcohol Use - no Drug Use - no  Risk Factors: Smoking Status: quit (09/04/2010)  Review of Systems       The patient  complains of fatigue, shortness of breath, and dizziness.  The patient denies malaise, fever, weight gain/loss, vision loss, decreased hearing, hoarseness, chest pain, palpitations, prolonged cough, wheezing, sleep apnea, coughing up blood, abdominal pain, blood in stool, nausea, vomiting, diarrhea, heartburn, incontinence, blood in urine, muscle weakness, joint pain, leg swelling, rash, skin lesions, headache, fainting, depression, anxiety, enlarged lymph nodes, easy bruising or bleeding, and environmental allergies.    Vital Signs:  Patient profile:   77 year old male Height:      73 inches Weight:      236 pounds BMI:     31.25 O2 Sat:      95 % on 2 L/min via [place] Pulse rate:   60 / minute BP sitting:   127 / 64  (left arm) Cuff size:   large  Vitals Entered By: Carlye Grippe (September 04, 2010 2:29 PM)  O2 Flow:  2 L/min via [place]  Physical Exam  Additional Exam:  General: Well-developed, well-nourished in no distress, patient is on oxygen therapy. head: Normocephalic and atraumatic eyes PERRLA/EOMI intact, conjunctiva and lids normal nose: No deformity or lesions mouth normal dentition, normal posterior pharynx neck: Supple, no JVD.  No masses, thyromegaly or abnormal cervical nodes. No carotid bruits lungs: Normal breath sounds bilaterally without wheezing.  Normal percussion heart: regular rate and rhythm with normal S1 and S2, no S3 or S4.  PMI is normal.  No pathological murmurs abdomen: Normal bowel sounds, abdomen is soft and nontender without masses, organomegaly or hernias noted.  No hepatosplenomegaly musculoskeletal: Back normal, patient walks with a walker. pulsus: Pulse is normal in all 4 extremities Extremities: No peripheral pitting edema neurologic: Alert and oriented x 3 skin: Intact without lesions or rashes cervical nodes: No significant adenopathy psychologic: Normal affect    PPM Specifications Following MD:  Sherryl Manges, MD     PPM Vendor:  [facility]  Scientific     PPM Model Number:  [id number]     PPM Serial Number:  [id number] PPM DOI:  03/26/2007     PPM Implanting MD:  Sherryl Manges, MD  Lead 1    Location: RV     DOI: 03/26/2007     Model #: [id number]     Serial #: [id number]     Status: active   Indications:  Tachy-Brady Syndrome   PPM Follow Up Pacer Dependent:  Yes      Parameters Mode:  SSIR     Lower Rate Limit:  60     Upper Rate Limit:  120  Impression & Recommendations:  Problem # 1:  HYPERTENSION, PULMONARY (ICD-416.8) patient has severe pulmonary hypertension likely secondary to his underlying lung disease.  He will follow-up with Dr. Orson Aloe.  Problem # 2:  SICK SINUS/ TACHY-BRADY SYNDROME (ICD-427.81) the patient status post pacemaker implantation. His updated medication list for this problem includes:    Aspirin 81 Mg Tbec (Aspirin) .Marland Kitchen... Take one tablet by mouth daily    Verapamil Hcl Cr 240 Mg Cr-tabs (Verapamil hcl) .Marland Kitchen... Take 1 tablet by mouth two times a day    Carvedilol 6.25 Mg Tabs (Carvedilol) .Marland Kitchen... Take 1 tablet by mouth two times a day  Problem #  3:  ATRIAL FIBRILLATION (ICD-427.31) atrial fibrillationis well controlled.  The patient has a severe anemia in the past on Coumadin and this has been discontinued. His updated medication list for this problem includes:    Aspirin 81 Mg Tbec (Aspirin) .Marland Kitchen... Take one tablet by mouth daily    Carvedilol 6.25 Mg Tabs (Carvedilol) .Marland Kitchen... Take 1 tablet by mouth two times a day  Problem # 4:  SHORTNESS OF BREATH (ICD-786.05) patient has chronic dyspnea but this appears to be stable. His updated medication list for this problem includes:    Aspirin 81 Mg Tbec (Aspirin) .Marland Kitchen... Take one tablet by mouth daily    Verapamil Hcl Cr 240 Mg Cr-tabs (Verapamil hcl) .Marland Kitchen... Take 1 tablet by mouth two times a day    Torsemide 20 Mg Tabs (Torsemide) .Marland Kitchen... Take 3 tablet by mouth once a day    Metolazone 2.5 Mg Tabs (Metolazone) .Marland Kitchen... Take 1 tablet by mouth once a day as needed if weight  >3lbs    Carvedilol 6.25 Mg Tabs (Carvedilol) .Marland Kitchen... Take 1 tablet by mouth two times a day  Problem # 5:  AORTIC STENOSIS, MODERATE (ICD-424.1) we will continue to follow his aortic stenosis with follow-up echocardiograms.  At this point is moderate it also like the patient is a surgical candidate. His updated medication list for this problem includes:    Torsemide 20 Mg Tabs (Torsemide) .Marland Kitchen... Take 3 tablet by mouth once a day    Metolazone 2.5 Mg Tabs (Metolazone) .Marland Kitchen... Take 1 tablet by mouth once a day as needed if weight >3lbs    Carvedilol 6.25 Mg Tabs (Carvedilol) .Marland Kitchen... Take 1 tablet by mouth two times a day  Patient Instructions: 1)  Your physician wants you to follow-up in: 6 months. You will receive a reminder letter in the mail one-two months in advance. If you don't receive a letter, please call our office to schedule the follow-up appointment. 2)  Your physician recommends that you continue on your current medications as directed. Please refer to the Current Medication list given to you today.

## 2010-11-08 NOTE — Progress Notes (Signed)
Summary: edema   Phone Note Other Incoming   Caller: Angie with [facility] Summary of Call: At patient's house currently, has gained 8 pounds in last few days.  Few crackles, 1+ edema/slight.  States she can feel a little edema around abdomen.  Lips a little cyanotic and sat is down to 70%.  He is on their IV Lasix protocol, so she did give him 40mg  IV push this a.m.   Per Dr. Andee Lineman, advised Angie to send patient to ED for evaluation.  She verbalized understanding.     Initial call taken by: Hoover Brunette, LPN,  October 19, 2010 10:40 AM

## 2010-11-08 NOTE — Letter (Signed)
Summary: [facility] D/C DR. Wende Crease  [facility] D/C DR. Wende Crease   Imported By: Zachary George 10/16/2010 10:25:52  _____________________________________________________________________  External Attachment:    Type:   Image     Comment:   External Document

## 2010-11-08 NOTE — Assessment & Plan Note (Signed)
Summary: [facility]-POST [facility] PER PATIENT  Medications Added LANTUS SOLOSTAR 100 UNIT/ML SOLN (INSULIN GLARGINE) use as directed ZOFRAN 4 MG TABS (ONDANSETRON HCL) may take one tab up to three times a day as needed nausea & vomiting      Allergies Added:   Visit Type:  Follow-up Primary Provider:  Charm Barges   History of Present Illness: The patient is an 77 year old male with chronic respiratory failure secondary to COPD and pulmonary hypertension. The patient was recently hospitalized for acute on chronic diastolic heart failure as well as right-sided heart failure. He was diuresed. He was given instructions for home management in conjunction with advanced home care to use p.r.n. Zaroxolyn as well as IV Lasix. His wife denies currently carefully managing his volume status. The patient has chronic dyspnea. However his symptoms have improved significantly since hospital discharge. He has permanent atrial fibrillation status post pacemaker placement. He also has moderate aortic stenosis. He has history of coronary artery disease.  He denies any substernal chest pain. Has chronic dyspnea but appears to be stable he's an NYHA class 3/4. He denies any palpitations or syncope. The patient does report some nausea and promethazine is not always helping. The patient also did not have blood work done recently since his hospital discharge.  Preventive Screening-Counseling & Management  Alcohol-Tobacco     Smoking Status: quit     Year Quit: 1970  Current Medications (verified): 1)  Lantus Solostar 100 Unit/ml Soln (Insulin Glargine) .... Use As Directed 2)  Gabapentin 300 Mg Caps (Gabapentin) .... At Bedtime 3)  Aricept 10 Mg Tabs (Donepezil Hcl) .... Once Daily 4)  Potassium Chloride Crys Cr 20 Meq Cr-Tabs (Potassium Chloride Crys Cr) .... Take One Tablet By Mouth Twice A Day 5)  Namenda 10 Mg Tabs (Memantine Hcl) .... Two Times A Day 6)  Carbidopa-Levodopa 10-100 Mg Tabs (Carbidopa-Levodopa) .... Two  Times A Day 7)  Calcium Carbonate-Vitamin D 600-400 Mg-Unit  Tabs (Calcium Carbonate-Vitamin D) .... Once Daily 8)  Simvastatin 40 Mg Tabs (Simvastatin) .... Take 1/2 Tablet By Mouth Daily At Bedtime 9)  Aspirin 81 Mg Tbec (Aspirin) .... Take One Tablet By Mouth Daily 10)  Promethazine Hcl 25 Mg Tabs (Promethazine Hcl) .... As Needed 11)  Albuterol Sulfate (2.5 Mg/29ml) 0.083% Nebu (Albuterol Sulfate) .... As Needed 12)  Verapamil Hcl Cr 240 Mg Cr-Tabs (Verapamil Hcl) .... Take 1 Tablet By Mouth Two Times A Day 13)  Torsemide 20 Mg Tabs (Torsemide) .... Take 3 Tablet By Mouth Once A Day 14)  Metolazone 2.5 Mg Tabs (Metolazone) .... Take 1 Tablet By Mouth Once A Day As Needed If Weight >3lbs 15)  Glipizide 5 Mg Tabs (Glipizide) .... Take 1 Tablet By Mouth Two Times A Day 16)  Carvedilol 6.25 Mg Tabs (Carvedilol) .... Take 1 Tablet By Mouth Two Times A Day 17)  Colchicine 0.6 Mg Tabs (Colchicine) .... Take 1 Tablet By Mouth Once A Day 18)  Lorazepam 0.5 Mg Tabs (Lorazepam) .... Take 1 Tablet By Mouth Two Times A Day As Needed 19)  Zofran 4 Mg Tabs (Ondansetron Hcl) .... May Take One Tab Up To Three Times A Day As Needed Nausea & Vomiting  Allergies (verified): 1)  ! Pcn 2)  ! Allopurinol  Comments:  Nurse/Medical Assistant: The patient's medication list and allergies were reviewed with the patient and were updated in the Medication and Allergy Lists.  Past History:  Past Surgical History: Last updated: 05/30/2009 PCM - 03/26/07 -  Guidant INSIGNIA-1  -  Tachybrady syndrome & A FIb  Family History: Last updated: 10/16/2010 Negative FH of Diabetes, Hypertension, or Coronary Artery Disease  Social History: Last updated: 05/30/2009 Retired  Married  Tobacco Use - No.  Alcohol Use - no Drug Use - no  Risk Factors: Smoking Status: quit (10/16/2010)  Past Medical History: Current Problems:  AORTIC STENOSIS, MODERATE (ICD-424.1) DIASTOLIC HEART FAILURE, CHRONIC  (ICD-428.32) HYPERLIPIDEMIA-MIXED (ICD-272.4) SICK SINUS/ TACHY-BRADY SYNDROME (ICD-427.81) ATRIAL FIBRILLATION (ICD-427.31) Dementia Insulin-dependent diabetes mellitus Severe pulmonary hypertension 1. Permanent atrial fibrillation, off Coumadin secondary to GI bleed 2. Tachy-brady syndrome status post Guidant dual-chamber pacemaker. 3. Preserved LV function. 4. Valvular heart disease with moderate aortic stenosis. 5. Severe pulmonary hypertension, oxygen dependent. 6. Chronic anemia followed by Dr. Charm Barges.hemoglobin 9.9 July of 2010, white count 15.2 7. Insulin-dependent diabetes mellitus. 8. Dyslipidemia. 9. Dementia. 10.The patient has chronic diastolic heart failure. Sleep apnea on CPAP. Hospitalization December 2011 for acute and chronic diastolic heart failure and right-sided heart failure symptoms/volume overload  Family History: Negative FH of Diabetes, Hypertension, or Coronary Artery Disease  Review of Systems       The patient complains of fatigue, malaise, shortness of breath, sleep apnea, muscle weakness, and leg swelling.  The patient denies fever, weight gain/loss, vision loss, decreased hearing, hoarseness, chest pain, palpitations, prolonged cough, wheezing, coughing up blood, abdominal pain, blood in stool, nausea, vomiting, diarrhea, heartburn, incontinence, blood in urine, joint pain, rash, skin lesions, headache, fainting, dizziness, depression, anxiety, enlarged lymph nodes, easy bruising or bleeding, and environmental allergies.    Vital Signs:  Patient profile:   77 year old male Height:      73 inches Weight:      237 pounds O2 Sat:      96 % on 2 L/min Pulse rate:   66 / minute BP sitting:   111 / 71  (left arm) Cuff size:   large  Vitals Entered By: Carlye Grippe (October 16, 2010 11:36 AM)  O2 Flow:  2 L/min  Physical Exam  Additional Exam:  General: Well-developed, well-nourished in no distress, patient is on oxygen therapy. head:  Normocephalic and atraumatic eyes PERRLA/EOMI intact, conjunctiva and lids normal nose: No deformity or lesions mouth normal dentition, normal posterior pharynx neck: Supple, no JVD.  No masses, thyromegaly or abnormal cervical nodes. No carotid bruits lungs: Normal breath sounds bilaterally without wheezing.  Normal percussion heart: regular rate and rhythm with normal S1 and S2, no S3 or S4.  PMI is normal.  2-3/6 crescendo decrescendo murmur abdomen: Normal bowel sounds, abdomen is soft and nontender without masses, organomegaly or hernias noted.  No hepatosplenomegaly musculoskeletal: Back normal, patient walks with a walker. pulsus: Pulse is normal in all 4 extremities Extremities: No peripheral pitting edema neurologic: Alert and oriented x 3 skin: Intact without lesions or rashes cervical nodes: No significant adenopathy psychologic: Normal affect    EKG  Procedure date:  10/16/2010  Findings:      atrial fibrillation with ventricular paced rhythm. Heart rate 63 beats per minute  PPM Specifications Following MD:  Sherryl Manges, MD     [facility] Vendor:  [facility] [employer]     PPM Model Number:  [id number]     PPM Serial Number:  [id number] DOI:  03/26/2007     PPM Implanting MD:  Sherryl Manges, MD  Lead 1    Location: RV     DOI: 03/26/2007     Model #: [id number]     Serial #: [id number]  Status: active   Indications:  Tachy-Brady Syndrome   PPM Follow Up Pacer Dependent:  Yes      Parameters Mode:  SSIR     Lower Rate Limit:  60     Upper Rate Limit:  120  Impression & Recommendations:  Problem # 1:  AORTIC STENOSIS, MODERATE (ICD-424.1) stable. Systolic murmur is stable His updated medication list for this problem includes:    Torsemide 20 Mg Tabs (Torsemide) .Marland Kitchen... Take 3 tablet by mouth once a day    Metolazone 2.5 Mg Tabs (Metolazone) .Marland Kitchen... Take 1 tablet by mouth once a day as needed if weight >3lbs    Carvedilol 6.25 Mg Tabs (Carvedilol) .Marland Kitchen... Take 1 tablet by mouth two  times a day  Problem # 2:  CHRONIC DIASTOLIC HEART FAILURE (ICD-428.32) patient volume status is stable. He did gain some weight and his wife will give him extra metolazone. She also knows to call the past home care in case he needs IV Lasix.we will need followup blood work including blood count and electrolytes. His updated medication list for this problem includes:    Aspirin 81 Mg Tbec (Aspirin) .Marland Kitchen... Take one tablet by mouth daily    Verapamil Hcl Cr 240 Mg Cr-tabs (Verapamil hcl) .Marland Kitchen... Take 1 tablet by mouth two times a day    Torsemide 20 Mg Tabs (Torsemide) .Marland Kitchen... Take 3 tablet by mouth once a day    Metolazone 2.5 Mg Tabs (Metolazone) .Marland Kitchen... Take 1 tablet by mouth once a day as needed if weight >3lbs    Carvedilol 6.25 Mg Tabs (Carvedilol) .Marland Kitchen... Take 1 tablet by mouth two times a day  Problem # 3:  PACEMAKER, PERMANENT (ICD-V45.01) patient status post pacemaker placement. He is in chronic atrial fibrillation. Is not a Coumadin candidate.  Problem # 4:  ATRIAL FIBRILLATION (ICD-427.31) rate control. His updated medication list for this problem includes:    Aspirin 81 Mg Tbec (Aspirin) .Marland Kitchen... Take one tablet by mouth daily    Carvedilol 6.25 Mg Tabs (Carvedilol) .Marland Kitchen... Take 1 tablet by mouth two times a day  Orders: EKG w/ Interpretation (93000) T-CBC No Diff (29528-41324) T-Comprehensive Metabolic Panel (40102-72536)  Patient Instructions: 1)  Labs today:  CBC, CMET 2)  Zofran 4mg  for nausea 3)  Follow up in  3 months Prescriptions: ZOFRAN 4 MG TABS (ONDANSETRON HCL) may take one tab up to three times a day as needed nausea & vomiting  #30 x 0   Entered by:   Hoover Brunette, LPN   Authorized by:   Nelida Meuse, PA-C   Signed by:   Hoover Brunette, LPN on 64/40/3474   Method used:   Electronically to        [facility] # [phone]* (retail)       [address]       [place], [place]  [address]       Ph: [phone] or [phone]       Fax: [phone]   RxID:    [id number]

## 2010-11-08 NOTE — Assessment & Plan Note (Signed)
Summary: 1 YR FU PER AUG REMINDER-SRS      Allergies Added:   Visit Type:  Pacemaker check Primary Provider:  Charm Barges   History of Present Illness: The patient presents today for routine electrophysiology followup.  He is chronically O2 dependant due to COPD.  He feels that his SOB is quite stable.  He denies significant limitations at this time.  The patient denies symptoms of palpitations, chest pain,  orthopnea, PND, lower extremity edema, dizziness, presyncope, syncope, or neurologic sequela. The patient is tolerating medications without difficulties and is otherwise without complaint today.   Preventive Screening-Counseling & Management  Alcohol-Tobacco     Smoking Status: quit     Year Quit: 1970  Current Medications (verified): 1)  Lantus Solostar 100 Unit/ml Soln (Insulin Glargine) .... Use As Directed 2)  Gabapentin 300 Mg Caps (Gabapentin) .... At Bedtime 3)  Aricept 10 Mg Tabs (Donepezil Hcl) .... Once Daily 4)  Potassium Chloride Crys Cr 20 Meq Cr-Tabs (Potassium Chloride Crys Cr) .... Take One Tablet By Mouth Twice A Day 5)  Namenda 10 Mg Tabs (Memantine Hcl) .... Two Times A Day 6)  Carbidopa-Levodopa 10-100 Mg Tabs (Carbidopa-Levodopa) .... Two Times A Day 7)  Calcium Carbonate-Vitamin D 600-400 Mg-Unit  Tabs (Calcium Carbonate-Vitamin D) .... Once Daily 8)  Simvastatin 40 Mg Tabs (Simvastatin) .... Take 1/2 Tablet By Mouth Daily At Bedtime 9)  Aspirin 81 Mg Tbec (Aspirin) .... Take One Tablet By Mouth Daily 10)  Zofran 4 Mg Tabs (Ondansetron Hcl) .... Take One Tablet Daily As Needed Nausea 11)  Albuterol Sulfate (2.5 Mg/8ml) 0.083% Nebu (Albuterol Sulfate) .... As Needed 12)  Verapamil Hcl Cr 240 Mg Cr-Tabs (Verapamil Hcl) .... Take 1 Tablet By Mouth Two Times A Day 13)  Torsemide 20 Mg Tabs (Torsemide) .... Take 3 Tablet By Mouth Once A Day 14)  Metolazone 2.5 Mg Tabs (Metolazone) .... Take 1 Tablet By Mouth Once A Day As Needed If Weight >3lbs 15)  Glipizide 5 Mg  Tabs (Glipizide) .... Take 1 Tablet By Mouth Two Times A Day 16)  Carvedilol 6.25 Mg Tabs (Carvedilol) .... Take 1 Tablet By Mouth Two Times A Day 17)  Colchicine 0.6 Mg Tabs (Colchicine) .... Take 1 Tablet By Mouth Once A Day 18)  Lorazepam 0.5 Mg Tabs (Lorazepam) .... Take 1 Tablet By Mouth Two Times A Day As Needed  Allergies (verified): 1)  ! Pcn 2)  ! Allopurinol  Comments:  Nurse/Medical Assistant: The patient's medications and allergies were reviewed with the patient and were updated in the Medication and Allergy Lists. Tammi Romine CMA (October 29, 2010 4:03 PM)  Past History:  Past Medical History: Reviewed history from 10/16/2010 and no changes required. Current Problems:  AORTIC STENOSIS, MODERATE (ICD-424.1) DIASTOLIC HEART FAILURE, CHRONIC (ICD-428.32) HYPERLIPIDEMIA-MIXED (ICD-272.4) SICK SINUS/ TACHY-BRADY SYNDROME (ICD-427.81) ATRIAL FIBRILLATION (ICD-427.31) Dementia Insulin-dependent diabetes mellitus Severe pulmonary hypertension 1. Permanent atrial fibrillation, off Coumadin secondary to GI bleed 2. Tachy-brady syndrome status post Guidant dual-chamber pacemaker. 3. Preserved LV function. 4. Valvular heart disease with moderate aortic stenosis. 5. Severe pulmonary hypertension, oxygen dependent. 6. Chronic anemia followed by Dr. Charm Barges.hemoglobin 9.9 July of 2010, white count 15.2 7. Insulin-dependent diabetes mellitus. 8. Dyslipidemia. 9. Dementia. 10.The patient has chronic diastolic heart failure. Sleep apnea on CPAP. Hospitalization December 2011 for acute and chronic diastolic heart failure and right-sided heart failure symptoms/volume overload  Past Surgical History: Reviewed history from 05/30/2009 and no changes required. PCM - 03/26/07 -  Guidant INSIGNIA-1  -  Tachybrady syndrome & A FIb  Social History: Reviewed history from 05/30/2009 and no changes required. Retired  Married  Tobacco Use - No.  Alcohol Use - no Drug Use - no  Review  of Systems       All systems are reviewed and negative except as listed in the HPI.   Vital Signs:  Patient profile:   77 year old male Height:      73 inches Weight:      234 pounds BMI:     30.98 O2 Sat:      96 % on 2 L/min Pulse rate:   62 / minute BP sitting:   121 / 69  (left arm) Cuff size:   large  Vitals Entered By: Fuller Plan CMA (October 29, 2010 3:59 PM)  O2 Flow:  2 L/min  Physical Exam  General:  Well developed, well nourished, in no acute distress. Head:  normocephalic and atraumatic Eyes:  PERRLA/EOM intact; conjunctiva and lids normal. Mouth:  Teeth, gums and palate normal. Oral mucosa normal. Neck:  Neck supple, no JVD. No masses, thyromegaly or abnormal cervical nodes. Lungs:  Clear bilaterally to auscultation and percussion. Heart:  RRR (paced), 3/6 SEM LUSB which is late peaking, though S2 is audible Abdomen:  Bowel sounds positive; abdomen soft and non-tender without masses, organomegaly, or hernias noted. No hepatosplenomegaly. Msk:  Back normal, normal gait. Muscle strength and tone normal. Extremities:  No clubbing or cyanosis. Neurologic:  Alert and oriented x 3.   PPM Specifications Following MD:  Sherryl Manges, MD     [facility] Vendor:  [facility] [employer]     PPM Model Number:  [id number]     PPM Serial Number:  [id number] PPM DOI:  03/26/2007     PPM Implanting MD:  Sherryl Manges, MD  Lead 1    Location: RV     DOI: 03/26/2007     Model #: [id number]     Serial #: [id number]     Status: active   Indications:  Tachy-Brady Syndrome   PPM Follow Up Battery Voltage:  GOOD V     Battery Est. Longevity:  >5 YRS     Pacer Dependent:  Yes     Right Ventricle  Amplitude: 10.0 mV, Impedance: 490 ohms, Threshold: 0.6 V at 0.40 msec  Episodes MS Episodes:  0     Ventricular High Rate:  0     Ventricular Pacing:  95%  Parameters Mode:  SSIR     Lower Rate Limit:  60     Upper Rate Limit:  120 Next Cardiology Appt Due:  04/08/2011 Tech Comments:  NORMAL DEVICE FUNCTION.   NO EPISODES SINCE LAST CHECK.  NO CHANGES MADE. ROV IN 6 MTHS W/DEVICE CLINIC. Vella Kohler  October 29, 2010 4:18 PM  MD Comments:  agree  Impression & Recommendations:  Problem # 1:  ATRIAL FIBRILLATION (ICD-427.31) stable and rate controlled (95% V paced) not felt to be a coumadin candidate due to prior GI bleeding continue ASA  Problem # 2:  SICK SINUS/ TACHY-BRADY SYNDROME (ICD-427.81) normal pacemaker function no changes today  Problem # 3:  DIASTOLIC HEART FAILURE, CHRONIC (ICD-428.32) stable

## 2010-11-08 NOTE — Consult Note (Signed)
Summary: [facility] CARDIOLOG[facility] CONSULT  [facility] C[facility]   Imported By: Zachary George 10/16/2010 10:22:43  _____________________________________________________________________  External Attachment:    Type:   Image     Comment:   External Document

## 2010-11-08 NOTE — Progress Notes (Signed)
Summary: 4 pound weight gain      Phone Note Other Incoming Call back at [phone]   Caller: Linton Flemings - [facility] Summary of Call: At patient home today, reporting 4 pound weight gain since yesterday.  D/C from hospital on 1/15.  Wife did start giving him his Metolazone daily since Tuesday.  Eber Jones states that they do have IV Lasix in the home to use as needed.  No other symptoms.   Hoover Brunette, LPN  October 26, 2010 9:31 AM   Follow-up for Phone Call        can have one-time dose of 40 mg of IV Lasix today or tomorrow. Continue to follow weight Follow-up by: Lewayne Bunting, MD, [facility],  October 26, 2010 12:33 PM  Additional Follow-up for Phone Call Additional follow up Details #1::        Left message to return call.  Hoover Brunette, LPN  October 26, 2010 1:32 PM     Additional Follow-up for Phone Call Additional follow up Details #2::    Eber Jones with [facility] notified of above. Hoover Brunette, LPN  October 26, 2010 3:09 PM

## 2010-11-08 NOTE — Miscellaneous (Signed)
Summary: med update  Clinical Lists Changes  Medications: Changed medication from PROMETHAZINE HCL 25 MG TABS (PROMETHAZINE HCL) as needed to ZOFRAN 4 MG TABS (ONDANSETRON HCL) take one tablet daily as needed nausea

## 2010-11-08 NOTE — Medication Information (Signed)
Summary: [facility] MEDICATION SHEET ORDER  [facility] MEDICATION SHEET ORDER   Imported By: Zachary George 10/16/2010 10:25:23  _____________________________________________________________________  External Attachment:    Type:   Image     Comment:   External Document

## 2010-11-08 NOTE — Cardiovascular Report (Signed)
Summary: TTM   TTM   Imported By: Roderic Ovens 10/05/2010 11:43:39  _____________________________________________________________________  External Attachment:    Type:   Image     Comment:   External Document

## 2010-11-14 NOTE — Cardiovascular Report (Signed)
Summary: [facility]  [facility]   Imported By: Dorise Hiss 11/09/2010 10:46:20  _____________________________________________________________________  External Attachment:    Type:   Image     Comment:   External Document

## 2010-12-17 ENCOUNTER — Encounter: Payer: Self-pay | Admitting: Internal Medicine

## 2010-12-17 DIAGNOSIS — I495 Sick sinus syndrome: Secondary | ICD-10-CM

## 2010-12-25 NOTE — Letter (Signed)
Summary: [facility]ence/  [facility]  [facility]ence/  [facility]   Imported By: Dorise Hiss 12/20/2010 09:16:35  _____________________________________________________________________  External Attachment:    Type:   Image     Comment:   External Document

## 2010-12-26 ENCOUNTER — Ambulatory Visit: Payer: Self-pay

## 2011-01-03 NOTE — Cardiovascular Report (Signed)
Summary: TTM   TTM   Imported By: Roderic Ovens 12/28/2010 14:43:42  _____________________________________________________________________  External Attachment:    Type:   Image     Comment:   External Document

## 2011-02-19 NOTE — Assessment & Plan Note (Signed)
[facility]                             PULMONARY OFFICE NOTE   NAME:[name], [name]                        MRN:          [id number]  DATE:04/29/2007                            DOB:          18-Sep-1925    REFERRING PHYSICIAN:  Duke Salvia, MD, [facility]   HISTORY OF PRESENT ILLNESS:  [name] is a pleasant 77 year old  Caucasian gentleman who is referred for an evaluation of dyspnea. He  does have a history of congestive heart failure and recently had a  pacemaker implanted for Tachy-Brady syndrome. He is referred for  evaluation of pulmonary causes of his dyspnea. 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 He has been maintained  on home oxygen by his medical doctor in [place] for at least the past year.  He describes dyspnea on exertion. He can walk to the mailbox and back.  He describes a history suggestive of paroxysmal nocturnal dyspnea where  it is difficult for him to lay down flat. He generally sleeps with two  pillows and describes an occasional dry cough. He has no history of  nocturnal wheezing or recurrent chest colds.   PAST MEDICAL HISTORY:  Hypertension, congestive heart failure, tachy-  brady syndrome, diabetes, hyperlipidemia, prostate cancer, obstructive  sleep apnea that was diagnosed three years ago at [facility], however he  did not tolerate the nasal CPAP due to claustrophobia.   PAST SURGICAL HISTORY:  Prostatectomy in 1998, pulmonary pacemaker  03/2007, bleeding stomach ulcer in 1999.   ALLERGIES:  None known. He is intolerant of aspirin due to his duodenal  ulcer.   CURRENT MEDICATIONS:  1. Aricept 10 mg daily.  2. Glimepiride 4 mg b.i.d.  3. Furosemide 80 mg b.i.d.  4. Warfarin 5 mg daily.  5. Verapamil 240 mg b.i.d.  6. Metformin 850 mg b.i.d.  7. K-Dur 20 mEq b.i.d.  8. Januvia 100 mg daily.  9. Namenda 10 mg b.i.d.  10.Metoprolol 25 mg b.i.d.  11.Lisinopril 40 mg daily.  12.Lanoxin 0.25 mg daily.  13.Simvastatin 20 mg daily.  14.Levemir insulin.  15.Promethazine p.r.n.   SOCIAL HISTORY:  Thirty pack years __________, he is married and lives  with wife. He is a retired Scientist, product/process development.   FAMILY HISTORY:  His sister had lung cancer and his brother had prostate  cancer. His father died of heart disease.   REVIEW OF SYSTEMS:  He reports dyspnea on exertion with activity,  occasional irregular heartbeat, and a non-productive cough.   PHYSICAL EXAMINATION:  GENERAL:  He appears his stated age.  VITAL SIGNS:  Weight 226 pounds, afebrile, blood pressure 119/72, heart  rate 60 beats-per-minute, oxygen saturation 98% on 2 liters nasal  canula.  HEENT:  Narrow pharyngeal space. No post-nasal drip. No thrush.  NECK:  Supple. No JVD. No lymphadenopathy.  CARDIAC:  S1, S2. Irregular. No murmur.  CHEST:  Clear to auscultation, no rhonchi.  ABDOMEN:  Soft and nontender.  NEUROLOGIC:  Non-focal.  EXTREMITIES:  No edema.   IMPRESSION:  1. Rule out chronic obstructive pulmonary disease as a cause of      dyspnea in this remote ex-smoker.  2. Congestive heart failure status post recent pulmonary pacemaker for      Tachy-Brady syndrome.  3. Obstructive sleep apnea intolerant of CPAP due to claustrophobia.   RECOMMENDATIONS:  1. Full PFTs will be obtained. We will also obtain an arterial blood      gas on room air.  2. If significant airway obstruction is present, I would use a long-      acting anticholinergic bronchodilator such as Tiotropium.  3. His heart failure seems to be well compensated at present.  4. I briefly discussed a need for CPAP compliance and its benefits for      heart failure. We will try to obtain his sleep study and echo      report, and pursue this on future visits.     Oretha Milch, MD  Electronically Signed    [facility]/Med[facility]  DD: 04/29/2007  DT: 04/30/2007  Job #: [id number]   cc:    Duke Salvia, MD, [facility]

## 2011-02-19 NOTE — Assessment & Plan Note (Signed)
[facility]                         ELECTROPHYSIOLOGY OFFICE NOTE   NAME:FOLEYBesnik, Febus                        MRN:          [id number]  DATE:07/27/2007                            DOB:          Dec 09, 1924    CHIEF COMPLAINT:  Mr. Petion is seen status post pacer implantation for  bradycardia __________  permanent atrial fibrillation.  He also has  oxygen-dependent chronic obstructive pulmonary disease and obstructive  sleep apnea.  He is doing pretty well.   He has been seeing Dr. Oretha Milch as well as Dr. Learta Codding.   CURRENT MEDICATIONS:  Are reviewed and are unchanged.  He continues to  wear oxygen.   PHYSICAL EXAMINATION:  VITAL SIGNS:  Blood pressure today 120/59, pulse  62.  LUNGS:  Clear.  HEART:  Sounds were regular.  EXTREMITIES:  Without edema.   Interrogation of his Guidant [place], #1190 pulse generator  demonstrates an R-wave of 4.2 with impedance of 580, threshold of 0.9 at  0.4.  Auto capture is on.  Rate response was relatively flat.  I re-  programmed the NVO2 from 3 to 4.   I will plan to see him again in nine months' time.     Duke Salvia, MD, [facility]  Electronically Signed    SCK/MedQ  DD: 07/27/2007  DT: 07/28/2007  Job #: [id number]   cc:   [facility] [place], [place]

## 2011-02-19 NOTE — Assessment & Plan Note (Signed)
[facility]                          [facility] OFFICE NOTE   NAME:[name], [name]                        MRN:          [id number]  DATE:01/31/2009                            DOB:          Feb 14, 1925    REFERRING PHYSICIAN:  Samuel Jester   HISTORY OF PRESENT ILLNESS:  The patient is an elderly male with a  history of severe COPD and pulmonary hypertension as well as oxygen  dependence.  The patient also has permanent atrial fibrillation and is  on Coumadin therapy.  The patient was previously on Coumadin therapy,  but has been discontinued due to the safety concerns regarding falls.  The patient states that he is doing well.  His appetite however has  decreased.  He has lost about 6 pounds in weight.  He has had no chest  pain or shortness of breath.  He had a Cardiolite in 2005, which showed  no ischemia.   MEDICATIONS:  1. Insulin.  2. Lisinopril 40 mg one-quarter of a tablet nightly.  3. Gabapentin 300 mg p.o. nightly.  4. Vitamin D.  5. Aricept 10 mg p.o. daily.  6. Verapamil 240 mg p.o. b.i.d.  7. Metformin 850 mg p.o. b.i.d.  8. Potassium.  9. Namenda.  10.Furosemide 80 mg p.o. b.i.d.  11.Metoprolol succinate ER 50 mg half tablet b.i.d.  12.Carbidopa and levodopa 10/100 b.i.d.  13.Lanoxin 250 mcg p.o. daily.  14.Simvastatin 40 mg half tablet p.o. daily.  15.Aspirin 81 mg p.o. daily.  16.Probenecid 500 mg p.o. b.i.d.   PHYSICAL EXAMINATION:  VITAL SIGNS:  Blood pressure 117/72, heart rate  59, weight is 210 pounds.  NECK:  Normal carotid stroke.  No carotid bruits.  LUNGS:  Clear.  Breath sounds a bit diminished.  Overall, no wheezing.  HEART:  Regular rate and rhythm, but with a paradoxically split second  heart sound.  There is also a 2-3/6 crescendo-decrescendo murmur at the  left upper sternal border radiating to the left lower sternal border.  ABDOMEN:  Soft, nontender.  No rebound or guarding.  Good bowel sound.  EXTREMITIES:  No cyanosis, clubbing, or edema.   PROBLEMS:  1. Permanent atrial fibrillation, off Coumadin.  2. Tachy-brady syndrome status post Guidant dual-chamber pacemaker.  3. Preserved LV function.  4. Valvular heart disease with moderate aortic stenosis.  5. Severe pulmonary hypertension, oxygen dependent.  6. Chronic anemia followed by Dr. Charm Barges.  7. Insulin-dependent diabetes mellitus.  8. Dyslipidemia.  9. Dementia.  10.The patient has chronic diastolic heart failure.   PLAN:  1. From a cardiac perspective, the patient is doing as well as we can      hope for.  He has no shortness of breath at rest.  He seems to be      comfortable showing no signs of depression or anxiety.  2. The patient does not need any further workup for ischemia at this      point in time.  I have made no changes in his medical regimen.  His      EKG with and without magnet are within normal  range with adequate      magnet rate.  We will follow up the patient in 6 months.     Learta Codding, MD,FACC  Electronically Signed    GED/MedQ  DD: 01/31/2009  DT: 02/01/2009  Job #: [id number]   cc:   Samuel Jester

## 2011-02-19 NOTE — Assessment & Plan Note (Signed)
[facility]                         ELECTROPHYSIOLOGY OFFICE NOTE   NAME:FOLEYRaiyan, [name]                        MRN:          [id number]  DATE:04/13/2007                            DOB:          Nov 12, 1924    REASON FOR VISIT:  Mr. [name] was seen today in the clinic, April 13, 2007,  for a wound check of his newly implanted Guidant model #1190 Insignia.  Date of implant was March 26, 2007 for tachybrady syndrome.   CLINICAL DATA:  On interrogation of his device today his battery voltage  was at beginning of life.  R waves measured 6.6 millivolts with  ventricular pacing threshold of 0.2 volts at 0.4 millisecond and a  ventricular lead impedance of 530 ohms.  He is ventricularly paced 97%  of the time.  He is in the SSIR mode with histograms being slightly  blunted but I think with the amount of activity that this gentleman  does, they are appropriate.   PLAN:  No changes were made in his parameters today.  I did remove his  Steri-Strips.  His wound is without redness or edema.  He was  complaining of some dizziness when he gets to a standing position and  his medications were changed.  He was started on some Verapamil.  He has  a followup appointment with Dr. Andee Mccoy in the [facility] next Monday on  April 20, 2007 and I told him to mention this to Dr. Andee Mccoy and to  obviously stand up on a slower basis and go to a sitting position first  before standing.  If the dizziness persists or gets worse, I advised him  to call Dr. Andee Mccoy right away.  He has a followup appointment scheduled  with Dr. Graciela Mccoy in October.  No other changes are made in his parameters  today.      Altha Harm, LPN  Electronically Signed      Duke Salvia, MD, [facility]  Electronically Signed   PO/MedQ  DD: 04/13/2007  DT: 04/13/2007  Job #: [id number]

## 2011-02-19 NOTE — Assessment & Plan Note (Signed)
[facility]                          [facility] OFFICE NOTE   NAME:FOLEYEdin, [name]                        MRN:          [id number]  DATE:11/17/2007                            DOB:          [birth year]    PRIMARY CARDIOLOGIST:  Learta Codding, MD.   PRIMARY ELECTROPHYSIOLOGIST:  Duke Salvia, MD.   REASON FOR VISIT:  Scheduled clinic followup.   HISTORY:  Since last seen here in the clinic in July 2008 by Dr. Andee Lineman,  Mr. [name] was hospitalized here at [facility] for treatment of  right lung community-acquired pneumonia in the setting of a fall.  There  was no documented syncope.  According to his history today, he  apparently lost his balance while he was standing and reaching up for  something.   We were not requested for formal consultation.  He was on Coumadin for  permanent atrial fibrillation prior to admission, however, this was  discontinued secondary to the history of frequent falls as well as  finding of hemarthroses of both knees.  He was not, however, discharged  on low-dose aspirin in its place.  The patient was treated with IV  antibiotics for pneumonia and cellulitis of the right arm.   He was transferred to a nursing home for rehabilitation, where he just  completed a 3 week stay.  He was seen by his primary care physician, Dr.  Samuel Jester, in the clinic yesterday.  No medication adjustments were  made at that time.  Clinically, Mr. [name] denies any presyncope,  syncope, dizziness, tachy palpitations, paroxysmal nocturnal dyspnea,  orthopnea or chest pain.  He is currently in a wheelchair, but does  ambulate occasionally with the use of a walker.  Of note, he was  apparently just diagnosed with Parkinson's disease, by Dr. Ninetta Lights,  during this recent hospitalization and was started on Sinemet.  The  patient has known severe COPD and previously was on continuous home  oxygen.  He was also previously diagnosed with  obstructive sleep apnea,  but informs me that he does not use CPAP.   The patient denies any recent evidence of overt bleeding.  He has not  had any post-hospital followup blood work.   CURRENT MEDICATIONS:  1. Verapamil 240 mg b.i.d.  2. Aricept 10 mg daily.  3. Metformin 850 mg b.i.d.  4. Furosemide 80 mg b.i.d.  5. KCl 20 mEq b.i.d.  6. Namenda 10 mg b.i.d.  7. Metoprolol ER 25 mg b.i.d.  8. Sinemet 10/100 mg b.i.d.  9. Lyrica 50 mg daily.  10.Lisinopril 40 mg daily.  11.Lanoxin 0.25 mg daily.  12.Simvastatin 20 mg daily.  13.Levemir p.r.n.   PHYSICAL EXAMINATION:  VITAL SIGNS:  Blood pressure 97/52, pulse 60  regular, weight 213 (per patient).  GENERAL:  An 77 year old male, sitting upright in a wheelchair, in no  distress.  HEENT:  Normocephalic, atraumatic.  NECK:  Palpable bilateral carotid pulses without bruits.  Jugular venous  distention noted at 90 degrees.  LUNGS:  Clear to auscultation in all fields.  HEART:  Regular rate and rhythm (  S1 and S2) no significant murmurs.  ABDOMEN:  Soft, nontender.  EXTREMITIES:  No pedal edema.  NEURO:  No focal deficit.   IMPRESSION:  1. Permanent atrial fibrillation.      a.     Adequate rate control on verapamil, metoprolol and digoxin.  2. Coumadin anticoagulation.      a.     Recently discontinued, history of frequent falls and recent       bilateral knee hemarthrosis.      b.     Heme-positive stools, August 2008.      c.     [place] II score: 4.  3. Tachybrady syndrome.      a.     Status post Guidant Insignia dual-chamber pacemaker,       followed by Dr. Sherryl Manges  4. History of preserved left ventricular function.  5. Valvular heart disease.      a.     Moderate MR, moderately severe TR, by 2-D echo in 2006.  6. Severe pulmonary hypertension.  7. Obstructive sleep apnea, not compliant with CPAP.  8. Severe chronic obstructive pulmonary disease, oxygen dependent  9. Chronic diastolic heart failure.  10.Chronic  renal insufficiency.  11.Current hypotension.      a.     History of hypertension  12.Parkinson's disease, recently diagnosed  13.Chronic anemia.      a.     Remote history of gastrointestinal bleed on aspirin.  14.Type 2 diabetes mellitus.  15.Dyslipidemia.  16.Status post recent acute gout (knees).  17.Dementia.   PLAN:  1. Agree with recent recommendation to discontinue Coumadin, given the      patient's significant increased risk of bleeding.  We do, however,      recommend that he be placed back on low-dose aspirin for      prophylaxis.  The patient and his wife are in agreement with this.  2. Schedule a 2-D echocardiogram for reassessment of left ventricular      function, severity of valvular disease, and monitoring of severe      pulmonary hypertension.  3. Complete surveillance blood work with CMET, CBC, differential and      BNP level.  4. Decrease lisinopril to 20 mg daily, in light of current      hypotension.  5. Schedule return clinic follow up with myself and Dr. Andee Lineman in 3      months, or sooner if needed.      Gene Serpe, PA-C  Electronically Signed      Learta Codding, MD,FACC  Electronically Signed   GS/MedQ  DD: 11/17/2007  DT: 11/18/2007  Job #: [id number]   cc:   Samuel Jester

## 2011-02-19 NOTE — Assessment & Plan Note (Signed)
[facility]                          [facility] OFFICE NOTE   NAME:FOLEYArney, Mayabb                        MRN:          [id number]  DATE:04/27/2007                            DOB:          06-Sep-1925    HISTORY OF PRESENT ILLNESS:  Patient is a 77 year old male with a  history of COPD oxygen dependent.  Patient also has known valvular heart  disease with severe tricuspid regurgitation, severe pulmonary  hypertension.  Patient was recently referred to Dr. Graciela Husbands for a  pacemaker implantation secondary to a tachybrady syndrome.  Since  pacemaker implant the patient has been doing well.  He was restarted on  Coumadin.  In the interim he required hospitalization for GI bleeding  which was felt to be hemorrhoidal in nature.  Coumadin was held for  several days and patient was given vitamin K.  Today's INR is  subtherapeutic.  He denies any chest pains, shortness of breath at rest,  and no dizziness or weakness.   MEDICATIONS:  1. Furosemide 80 mg p.o. b.i.d.  2. Potassium 20 mEq p.o. b.i.d.  3. Lisinopril 40 mg p.o. daily.  4. Verapamil 240 mg p.o. b.i.d.  5. Metformin 850 mg p.o. b.i.d.  6. Warfarin as directed.  7. Lanoxin 0.25 nightly.  8. Aricept.  9. Glimepiride.  10.Namenda.  11.Simvastatin 40 mg 1/2 tablet nightly.  12.Metoprolol 50 mg 1/2 tablet p.o. b.i.d.   PHYSICAL EXAMINATION:  VITAL SIGNS:  Blood pressure 121/66, heart rate  is 59.  GENERAL:  Well-nourished white male in no apparent distress.  NECK:  Normal carotid upstroke and carotid bruits.  LUNGS:  Diminished breath sounds bilaterally but otherwise no wheezing.  HEART:  Regular rate and rhythm, normal S1 __________  S2.  ABDOMEN:  Soft.  EXTREMITY:  No cyanosis, clubbing, or edema.  NEURO:  Patient alert, oriented, grossly nonfocal.   PROBLEM:  1. Permanent atrial fibrillation.      a.     Tachybrady syndrome.      b.     __________ possibly is related to symptomatic  bradycardia.      c.     Status post Guidant Insignia I Ultra XR Dual Chamber       pacemaker.  2. Normal left ventricular systolic function.  3. Valvular heart disease.      a.     Moderate mitral regurgitation.      b.     Severe tricuspid regurgitation with severe pulmonary       hypertension.  4. Obstructive sleep apnea.  5. Chronic obstructive pulmonary disease with abnormal pulmonary      function test FEV1 52% of predicted, PLCO 66%.  6. Diastolic heart failure, stable.  7. Hemorrhoidal bleeding.   PLAN:  1. The patient is doing well from a cardiovascular perspective.  His      pacemaker appears to be functioning properly.  2. The patient stated he has had no further rectal bleeding.  He will      be restarted on Coumadin.  3. The patient per Dr. Graciela Husbands has an appointment scheduled  with our      pulmonary colleagues which will be later this week.  4. We will see the patient back in 6 months.     Learta Codding, MD,FACC  Electronically Signed    GED/MedQ  DD: 04/27/2007  DT: 04/27/2007  Job #: [id number]

## 2011-02-19 NOTE — Assessment & Plan Note (Signed)
[facility]                          [facility] OFFICE NOTE   NAME:[name], [name]                        MRN:          [id number]  DATE:03/24/2007                            DOB:          1925/02/09    HISTORY OF PRESENT ILLNESS:  The patient is a 77 year old male with a  history of permanent atrial fibrillation, previously seen by Gene Serpe.  The patient's last office visit was June 05, 2006. His heart rate was  well-controlled at that time and the patient's dyspnea is felt to be  secondary to a significant pulmonary hypertension. In the interim,  however, he required a hospitalization to the service of Dr.  Doyne Keel  for chest pain. The patient was ruled out for myocardial infarction and  was found to have atypical chest pain more pleuritic in nature. He also  reported to Dr.  Kathlene November episodes of dizziness. A CardioNet monitor was  ordered per Dr.  Doyne Keel which demonstrated significant pauses up to  greater than 3 seconds as well as tachybrady syndrome with heart rates  in the 20s and varying up to 125 beats per minute. The patient does  appear to be symptomatic with some of these episodes. He states that he  has been feeling weak and washed out.   An EKG today, shows a junctional rhythm heart rate of 50 beats per  minute.   MEDICATIONS:  1. Furosemide 80 mg p.o. b.i.d.  2. Potassium 20 mEq p.o. b.i.d.  3. Lisinopril 40 mg p.o. q nightly.  4. Verapamil 240 daily.  5. Metformin 850 b.i.d.  6. Warfarin 5 as directed.  7. Lanoxin 0.25 q nightly.  8. Januvia 100 mg p.o. daily.  9. Aricept 10 mg p.o. daily.  10.Glimepiride 4 mg p.o. b.i.d.  11.Namenda 10 mg p.o. b.i.d.  12.Simvastatin 40 mg half tablet p.o. q nightly.   PHYSICAL EXAMINATION:  VITAL SIGNS: Blood pressure 136/69, heart rate  59, pulse oximetry 94 on 2 liters of oxygen.  GENERAL: Overweight white male, but no apparent distress.  HEENT: Pupils anicteric, clear.  NECK:  Normal carotid upstroke with soft left supraclavicular bruit.  LUNGS:  Diminished breath sounds.  HEART: Irregularly irregular. Normal S1, S2. Soft murmur at the left  upper sternal border.  ABDOMEN: Soft.  EXTREMITIES: No edema.   PROBLEM LIST:  1. Permanent atrial fibrillation.      a.     Tachybrady syndrome.      b.     Weakness and dizziness possibly with symptomatic       bradycardia.  2. Normal left ventricular function.  3. Valvular heart disease.      a.     Moderate mitral regurgitation.      b.     Severe tricuspid regurgitation with severe pulmonary       hypertension.  4. Obstructive sleep apnea.  5. Chronic obstructive pulmonary disease with abnormal pulmonary      function tests, FEV1 is 52% of predicted DLC of 66%.  6. Diastolic heart failure, stable.   PLAN:  1. I have reviewed carefully the  patient's CardioNet monitor. He      clearly has what appears to be tachybrady syndrome.  2. I have called Dr.  Graciela Husbands and he was so kind to add on the patient      for pacemaker implantation for this Thursday.  3. The patient's PT/INR will be checked today and if his INR is      greater than 2.5, will hold his Coumadin. If less than 2.5, the      patient is instructed to Coumadin until the day before his      procedure.     Learta Codding, MD,FACC  Electronically Signed    GED/MedQ  DD: 03/24/2007  DT: 03/24/2007  Job #: [id number]

## 2011-02-19 NOTE — Assessment & Plan Note (Signed)
[facility]                             PULMONARY OFFICE NOTE   NAME:FOLEYTanyon, [name]                        MRN:          [id number]  DATE:05/20/2007                            DOB:          August 01, 1925    HISTORY OF PRESENT ILLNESS:  [name] returns for followup today. 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  PFT's show mild airway obstruction with an FEV1 or FVC ratio of 71 and  FEV1 of 63% without significant bronchodilator response. I have obtained  his sleep study report from [facility]. Apparently the baseline study  showed an apnea/hypopnea index of 19 with O2 desaturation to 84%. He was  then being maintained on 3 liters of oxygen. __________ . Moderate to  severe periodic limb movements were seen with an index of 44 per hour.  It is unclear to me whether these PLM's were associated with arousal's.  Of note, these PLM's did not resolve with treatment of his obstructive  events. Due to persistent hypoxemia on CPAP, 2 liters of oxygen were  added to obtain a mean O2 saturation of 97% during sleep. The [place] used  at CPAP for all of 30 minutes and said that he could not tolerate this  and this was never pursued further.   IMPRESSION:  1. Mild to moderate chronic obstructive pulmonary disease.  2. Moderate obstructive sleep apnea.  3. Congestive heart failure status post recent pacemaker for tachy-      brady syndrome.  4. Hypoxemia, although today, he seems to have an O2 saturation of 96%      on room air.   RECOMMENDATIONS:  1. I will attempt to wean [name] off his oxygen during the daytime.      I have asked him to prolong his duration off the oxygen. We will      assess room air saturation on ambulation during his next visit.  2. I have convinced him to try using the CPAP again. CPAP will be      reinstituted at +7 cm with nasal pillows and a humidifier with a      ramp of 15 minutes.  We will  counsel him during this adaptation      process.  3. Albuterol HFA MDI will be used p.r.n. dyspnea or wheezing. He will      return for a followup in 2 months time.     Oretha Milch, MD  Electronically Signed    RVA/MedQ  DD: 05/20/2007  DT: 05/21/2007  Job #: [id number]

## 2011-02-19 NOTE — Op Note (Signed)
NAME:  [name], [name] NO.:  [id number]   MEDICAL RECORD NO.:  [id number]          PATIENT TYPE:  INP   LOCATION:  2005                         FACILITY:  [facility]   PHYSICIAN:  Duke Salvia, MD, FACCDATE OF BIRTH:  November 23, 1924   DATE OF PROCEDURE:  03/26/2007  DATE OF DISCHARGE:                               OPERATIVE REPORT   PREOPERATIVE DIAGNOSIS:  Tachybrady syndrome.   POSTOPERATIVE DIAGNOSIS:  Tachybrady syndrome.   PROCEDURE PERFORMED:  Single-chamber pacemaker implantation secondary to  permanent atrial fibrillation.   PROCEDURE IN DETAIL:  Following the obtaining of an informed consent,  the patient was brought to the electrophysiology laboratory and placed  on the fluoroscopic table in the supine position.  After routine prep  and drape of the left upper chest, lidocaine was infiltrated in the pre-  pectoral and subclavicular regions.  An incision was made and carried  down to the layer of the prepectoral fascia with electrocautery and  sharp dissection.  A pocket was formed similarly.  Hemostasis was  obtained.   Thereafter our attention was turned to the extrathoracic left subclavian  vein which was accomplished without difficulty and without the  aspiration of air puncturing the artery.  A single guidewire was placed  and retained.  A 7-French sheath was placed and through this was passed  a [employer] 50-76-58 cm Active Fixation ventricular lead, serial #[id number]  [id number].  Under fluoroscopic guidance, it was admitted via the right  ventricular outflow tract where the bipolar R wave is 15.3 with a pacing  impedance of 971, a threshold of 1.1 volts and 0.5 milliseconds.  The  current threshold was 1.0 mA.  There was no definite __________ pacing  on 10 volts and the current of injury is brisk.  This sleeve was secured  to the prepectoral fascia and attached to a Guidant INSIGNIA-1, model  1190 pulse generator, serial [id number].  The pocket was  copiously  irrigated with antibiotic-containing saline solution.  Hemostasis was  assured and the lead and the pulse generator were placed in the pocket  and secured to the prepectoral fascia.  The wound was closed in 3 layers  in a normal fashion.  The wound was washed and dried, and a benzoin and  Steri-Strip dressing was applied.   The needle counts, sponge counts, and instrument counts were correct at  the end of the procedure according to the staff.   The patient tolerated the procedure without apparent complications.      Duke Salvia, MD, [facility]  Electronically Signed     SCK/MEDQ  D:  03/26/2007  T:  03/27/2007  Job:  [id number]   cc:   [facility]  Electrophysiology Laboratory  Dr. Yvonne Kendall

## 2011-02-19 NOTE — Assessment & Plan Note (Signed)
[facility]                          [facility]   NAME:[name], Derousse                        MRN:          [id number]  DATE:05/10/2008                            DOB:          [birth year]    PRIMARY CARDIOLOGIST:  Learta Codding, MD,FACC   PRIMARY ELECTRO PHYSIOLOGIST:  Duke Salvia, MD, [facility]   REASON FOR VISIT:  Scheduled followup.   [name] returns to our clinic for scheduled followup, after last seen  here in February 2009.  At that time, I scheduled a surveillance 2-D  echo and this revealed continued, preserved LVEF (EF 50-55%).  Of note,  there were multiple wall motion abnormalities.  The patient has no known  history of CAD and has not undergone prior cardiac catheterization,  according to his wife.  He had an adenosine Cardiolite in 2005, which  showed no perfusion abnormalities; EF 43%.   The recent 2-D echo also suggested mild/moderate aortic stenosis with  mild AR.  AVA was 2.82 cm2 with a mean gradient of 17 mmHg.  The  previous study in 2006 suggested only very mild aortic stenosis, but  with moderate MR.  The MR in this case was felt to be of a trace amount.  Also, whereas there was previous indication of severe pulmonary  hypertension with mildly severe TR, there is now only trace TR.  Pulmonary atrial pressures are not noted.   [name] does have a pacemaker and this is followed by Dr. Sherryl Manges  here in our [facility].  His wife reports that this was recently  checked, and apparently no adjustments were necessary.   Clinically, [name] appears to be doing well with no complaint of  chest pain, significant dyspnea, tachy palpitations, or syncope.  Given  his history of falls, he reportedly has not had any since January of  this year.  He also has history of GI bleed, but apparently has not had  any evidence of overt bleeding or melena.   EKG in our office today indicates atrial fibrillation at 72 bpm  with  chronic LBBB at a rate of 72.  With the magnet, the rate is 100.   CURRENT MEDICATIONS:  1. Sliding scale insulin.  2. Continuous oxygen (2 liters).  3. Aspirin 81 daily.  4. Simvastatin 20 daily.  5. Lanoxin 0.25 daily.  6. Lisinopril 20 daily.  7. Lyrica 50 daily.  8. Probenecid 500 b.i.d.  9. Sinemet 10/100 b.i.d.  10.Metoprolol succinate 25 b.i.d.  11.Furosemide 80 b.i.d.  12.Namenda 10 b.i.d.  13.KCL 10 b.i.d.  14.Metformin 850 b.i.d.  15.Verapamil 240 b.i.d.  16.Aricept 10 daily.   PHYSICAL EXAMINATION:  VITAL SIGNS:  Blood pressure 108/60, pulse 76,  regular, and weight 216 (up 3).  GENERAL:  An 77 year old male, sitting upright, and in no distress.  HEENT:  Normocephalic.  Atraumatic.  NECK:  Palpable carotid pulse without bruits; no JVD at 90 degrees.  LUNGS:  Clear to auscultation in all fields.  HEART:  RRR (S1S2) a grade 2-3/6 crescendo-decrescendo murmur heard  loudest at the base.  Preserved S2.  No diastolic blow.  ABDOMEN:  Soft, nontender.  EXTREMITIES:  Palpable posterior tibialis pulses with 1+ of pedal edema.  NEURO:  Very flat affect, but no focal deficit.   IMPRESSION:  1. Permanent atrial fibrillation.      a.     Adequate rate control, on verapamil, metoprolol, and       digoxin.      b.     CHAD2 score:  4.      c.     Coumadin discontinued, secondary to history of frequent       falls, bilateral knee hemarthrosis, and heme-positive stools.  2. Tachybrady syndrome.      a.     Status post Guidant dual-chamber pacemaker.  3. Preserved left ventricular ejection fraction.  4. Valvular heart disease.      a.     Mild/moderate aortic stenosis; no significant mitral       regurgitation by recent 2-D echo, February 2009.  5. Severe pulmonary hypertension.      a.     Oxygen-dependent.  6. Persistent hypotension.  7. Chronic anemia.      a.     Remote history of gastrointestinal bleed, on aspirin.      b.     Tolerating low-dose aspirin.  8.  Insulin-dependent diabetes mellitus.  9. Dyslipidemia.      a.     Followed by Dr. Charm Barges.  10.Dementia.  11.Chronic renal insufficiency.  12.Chronic diastolic heart failure.   PLAN:  1. Down titrate lisinopril further to 10 mg daily, in light of      persistent hypotension.  I previously decreased his dose from 40 to      20 mg day, when I last saw him here in the clinic.  2. Schedule return clinic followup with myself and Dr. Andee Lineman in 6      months.  Follow up with Dr. Sherryl Manges here in [facility],      as previously scheduled.  3. Recommend surveillance 2-D echo in 1 year, for continued close      monitoring of valvular disease.      Rozell Searing, PA-C  Electronically Signed      Jonelle Sidle, MD  Electronically Signed   GS/MedQ  DD: 05/11/2008  DT: 05/12/2008  Job #: [id number]   cc:   Samuel Jester

## 2011-02-19 NOTE — H&P (Signed)
NAME:  [name], [name] NO.:  [id number]   MEDICAL RECORD NO.:  [id number]          PATIENT TYPE:  INP   LOCATION:  2005                         FACILITY:  [facility]   PHYSICIAN:  Alexander Salvia, [name], FACCDATE OF BIRTH:  16-Sep-1925   DATE OF ADMISSION:  03/26/2007  DATE OF DISCHARGE:                              HISTORY & PHYSICAL   REASON FOR VISIT:  [name] is referred for consideration of  pacemaker implantation.   HISTORY:  He is a 77 year old with chronic oxygen-dependent lung  disease, the cause of which is not known, that has been long standing.  It is accompanied by significant pulmonary hypertension, tricuspid and  mitral regurgitation, and he has systolic congestive heart failure.   He has permanent atrial fibrillation.  He was admitted to [facility] a week or so ago because of pleuritic chest pain.  While in  hospital, he was noted to have tachy-brady syndrome and a CardioNet  monitor was placed, which identified heart rates going from the 20s to  the 120s and atrial fibrillation and pauses more than 3 seconds.  He was  referred for a pacemaker implantation.   The patient has a history of lightheadedness and some presyncope.  He  also has recurrent falls, which have been attributed to trips although  he does not remember specifically upon what he tripped.   His thromboembolic risk factors are noted for diabetes, hypertension,  and congestive heart failure.  He does not have a know prior stroke.   He currently has normal left ventricular function without prior  myocardial infarction.   CURRENT MEDICATIONS:  Include:  1. Furosemide 80 b.i.d.  2. Potassium 20 b.i.d.  3. Lisinopril 40.  4. Verapamil 240.  5. Metformin 850 b.i.d.  6. Coumadin.  7. Lanoxin 0.25.  8. Januvia.  9. Aricept.  10.Glimepiride 4 b.i.d.  11.Namenda 10 b.i.d.  12.Simvastatin.   ALLERGIES:  He is ALLERGIC TO PENICILLIN.   SOCIAL HISTORY:  He is married.   He has 2 children, one of whom died at  the age of 71 while swimming.  He was a grandchild.  He does use  cigarettes, alcohol, or recreational drugs.  He is retired from [employer] work.   REVIEW OF SYSTEMS:  Across multiple organ systems is unrelated.   PHYSICAL EXAMINATION:  GENERAL:  On examination today, this elderly  Caucasian male appeared his stated age of 77.  VITAL SIGNS:  His blood pressure is 132/67.  His pulse was 55 and  irregular.  HEENT EXAM:  Demonstrated no icterus or xanthomata.  NECK:  Veins were greater than 10.  Carotids were brisk bilaterally  without bruits.  BACK:  Without kyphosis or scoliosis.  LUNGS:  Clear.  HEART:  Heart sounds were irregular with a 2/6 murmur heard along the  right sternal border that varied with inspiration and a 2/6 murmur heard  at the apex that did not.  ABDOMEN:  Soft with active bowel sounds without midline pulsation or  hepatomegaly.  EXTREMITIES:  Femoral pulses were 2+.  Pulses were intact.  There was no  clubbing, cyanosis or edema.  NEUROLOGICAL EXAM:  Grossly normal.   Electrocardiogram is not yet available.  Chest x-ray was obtained as an  outpatient.   IMPRESSION:  1. Atrial fibrillation with tachy-brady syndrome.  2. Recurrent falls.  3. Diabetes.  4. PENICILLIN ALLERGY.   Mr. Normington clearly has significant tachy-brady syndrome.  Some of his  falls may be syncopal and may be related to his bradycardia.   I have reviewed the potential benefits, as well as the potential risks  with him and his family including, but not limited to death,  perforation, pneumothorax, lead dislodgment, and infection.  They  understand these risks and are willing to proceed.      Alexander Salvia, [name], [facility]  Electronically Signed     SCK/MEDQ  D:  03/26/2007  T:  03/26/2007  Job:  [id number]   cc:   Lewayne Bunting, [name]  [facility]  Electrophysiology Laboratory

## 2011-02-19 NOTE — Discharge Summary (Signed)
NAME:  [name], [name] NO.:  [id number]   MEDICAL RECORD NO.:  [id number]          PATIENT TYPE:  INP   LOCATION:  2005                         FACILITY:  [facility]   PHYSICIAN:  Berton Mount, MD        DATE OF BIRTH:  Jan 24, 1925   DATE OF ADMISSION:  03/26/2007  DATE OF DISCHARGE:  03/27/2007                               DISCHARGE SUMMARY   THIS PATIENT HAS AN ALLERGY TO PENICILLIN.   Time for this dictation and examination, greater than 45 minutes.   FINAL DIAGNOSIS:  Tachy/brady syndrome with 3 second pauses.   SECONDARY DIAGNOSES:  1. Discharging day 1, status post implant of Guidance Insignia I Ultra      XR dual-chamber pacemaker.  2. Fatigue and dizziness are symptoms with the patient's tachy/brady      syndrome.  3. Atrial fibrillation on chronic Coumadin.  4. Pulmonary hypertension with home oxygen.  5. Diabetes.  6. Valvular heart disease with severe tricuspid regurgitation.  7. Obstructive sleep apnea.  8. Chronic obstructive pulmonary disease.  9. Diastolic dysfunction.  10.Home oxygen.   PROCEDURE:  March 26, 2007, implantation of Guidance single-chamber  pacemaker by Dr. Sherryl Manges.  Patient has no post procedural  complications.   BRIEF HISTORY:  [name] is an 77 year old male.  He has a history of  permanent atrial fibrillation.  Up until a certain time ago, his heart  rate was well controlled.  His dyspnea is felt to be secondary to  significant pulmonary hypertension.   The patient required hospitalization recently by Dr. Doyne Keel for chest  pain.  He was ruled out for myocardial infarction.  His chest pain was  more pleuritic in nature.  He also reported episodes of dizziness and a  CardioNet monitor was ordered.  It showed significant pauses up to  greater than 3 seconds, as well as tachycardia/bradycardia.  Heart rates  in the 120s and falling into the 20s.  Patient does appear to be  symptomatic with some of these episodes.   Patient has been feeling  generally weak and washed out.  The patient will referred for pacemaker  implantation Thursday, June 19.   HOSPITAL COURSE:  Patient presented electively.  June 19, he underwent  implantation of the Guidant single-chamber device.  The post procedural  complications are negative.  No hematoma.  No significant pain at the  pacer incision.  His chest x-ray shows that he has no pneumothorax and  that the lead is in appropriate position.  The device interrogation  shows normal device function with all values within normal limits.  The  patient has been cautioned to be careful with movement of his left arm,  as he tends to flail with it.  He is also asked to avoid showering for  the next 7 days, to sponge bath until Thursday, June 26.   He has followup appointments and they are:  1. Pacer Clinic at [facility], Monday, July 7 at      9 o'clock.  2. To see Dr. Graciela Husbands, [facility] [place], Monday, October      20 at 2:10.  3. He follows up with [facility], [facility], across from      [facility], Wednesday, July 9 at 2:10.   DISCHARGE MEDICATIONS:  1. Verapamil 240 mg twice daily, one in the morning and one in the      evening, that is a new dose.  2. Toprol-XL 50 mg daily, this is new for heart rate control.  3. Aricept 10 mg daily.  4. Glimepiride 4 mg twice daily.  5. Furosemide 80 mg twice daily.  6. Potassium chloride 20 mEq twice daily.  7. Metformin 850 mg twice daily.  8. Januvia 100 mg daily.  9. Namenda 10 mg twice daily.  10.Lisinopril 40 mg daily.  11.Lanoxin 0.25 mg daily.  12.Simvastatin 20 mg daily.  13.Coumadin 5 mg daily, except 3.5 mg Friday and Sunday.   LABORATORY STUDIES:  Pertinent to this admission, June 19, hemoglobin  12.2, hematocrit 36.9, white cell is 13.4 and platelets are 249.  Serum  electrolytes:  Sodium 141, potassium 4.1, chloride 101, carbonate 31,  BUN is 17, creatinine  1.07 and glucose is 87.  Pro time is 23.2, INR is  2.0, PTT is 50.   Once again, greater than 40 minutes.      Maple Mirza, PA    ______________________________  Berton Mount, MD    GM/MEDQ  D:  03/27/2007  T:  03/27/2007  Job:  [id number]   cc:   Wende Crease, M.D.  Learta Codding, MD,FACC

## 2011-02-21 ENCOUNTER — Telehealth: Payer: Self-pay | Admitting: *Deleted

## 2011-02-21 NOTE — Telephone Encounter (Signed)
Wife (Opal) left message on voicemail - question about pt Metolazone.   No answer.

## 2011-02-22 NOTE — Assessment & Plan Note (Signed)
[facility]                            [facility]   NAME:FOLEYSkipper, Dacosta                        MRN:          [id number]  DATE:06/05/2006                            DOB:          12-17-1924    PRIMARY CARDIOLOGIST:  Learta Codding, MD, [facility].   REASON FOR VISIT:  Scheduled routine follow up.   HISTORY OF PRESENT ILLNESS:  Mr. Blubaugh is a 77 year old male with permanent  atrial fibrillation on chronic Coumadin, valvular heart disease with  moderate mitral regurgitation and moderately severe tricuspid regurgitation,  COPD, and severe pulmonary hypertension, who was last seen here in the  office by Dr. Andee Lineman in January of this year.   At that time, the patient was referred for blood work, which revealed normal  renal function.  Pulmonary function tests were also ordered, and were normal  per Dr. Orson Aloe.   The patient reports no interim development of decompensated heart failure.  In fact, he goes out of his way to tell me that he is not short of breath.  Although supplemental oxygen was recommended by Dr. Andee Lineman, the patient  feels that he has not needed this.  He denies any PND, orthopnea, and is  able to proceed with all of his daily activities without any significant  associated dyspnea.   The patient also denies any exertional chest discomfort or any tachy  palpitations.   The patient's wife is only concerned regarding whether or not Mr. Mascari can  enroll in an exercise program at a local facility.   CURRENT MEDICATIONS:  1. Lasix 80 mg b.i.d.  2. Potassium 20 mEq b.i.d.  3. Lisinopril 40 mg daily.  4. Verapamil 240 mg daily.  5. Metformin 850 mg b.i.d.  6. Coumadin 5 mg daily.  7. Lanoxin 0.25 mg daily.  8. Toprol XL 25 mg daily.  9. Glyburide 10 mg b.i.d.  10.Zocor 20 mg nightly.  11.Insulin as directed.  12.Januvia 100 mg daily.   PHYSICAL EXAMINATION:  VITAL SIGNS:  Blood pressure 132/70, pulse 60,  regular.   Weight 235 (down 3 pounds).  GENERAL:  77 year old male, sitting upright in no apparent distress.  HEENT:  Normocephalic and atraumatic.  NECK:  Palpable bilateral carotid pulses with soft left supraclavicular  bruit.  LUNGS:  Clear to auscultation in all fields.  HEART:  Irregularly irregular (S1, S2).  2-3/6 systolic ejection murmur  radiating up to the left infraclavicular region.  ABDOMEN:  Soft and nontender with intact bowel sounds.  EXTREMITIES:  Minimally palpable peripheral pulses with trace pedal edema.  NEUROLOGIC:  No focal deficit.   IMPRESSION:  1. Permanent atrial fibrillation.      a.     Adequate rate control on current medication regimen.  2. Chronic Coumadin.  3. Normal left ventricular function.      a.     Echocardiogram November of 2006.  4. Valvular heart disease.      a.     Moderate mitral regurgitation/moderately severe tricuspid       regurgitation.  5. Severe pulmonary hypertension.  6.  Obstructive sleep apnea.  7. COPD (chronic obstructive pulmonary disease).      a.     Normal pulmonary function tests, January of 2007.  8. Diastolic heart failure.      a.     Currently NYHA (New York Heart Association) Class I-II.   PLAN:  1. Continue current medication regimen.  2. Check followup BMET, if none done recently.  3. The patient strongly advised to start using supplemental oxygen at      night, given its mortality benefit, and in light of his significant      pulmonary hypertension.  4. Patient is cleared to initiate an exercise regimen.  5. Resume follow up with Dr. Andee Lineman in 6 months.  Of note, we will      consider a repeat echocardiogram at that time.                                   Gene Serpe, PA-C                                Learta Codding, MD, [facility]   GS/MedQ  DD:  06/05/2006  DT:  06/06/2006  Job #:  [id number]

## 2011-02-26 NOTE — Telephone Encounter (Signed)
Spoke with wife.  States Alcario had OV with Dr. Charm Barges on 5/17.  She answered medication questions.

## 2011-02-27 ENCOUNTER — Encounter: Payer: Self-pay | Admitting: Cardiology

## 2011-03-18 ENCOUNTER — Encounter: Payer: Self-pay | Admitting: Internal Medicine

## 2011-03-18 DIAGNOSIS — I495 Sick sinus syndrome: Secondary | ICD-10-CM

## 2011-04-11 ENCOUNTER — Encounter: Payer: Self-pay | Admitting: Internal Medicine

## 2011-05-09 ENCOUNTER — Ambulatory Visit: Payer: Medicare Other | Admitting: Cardiology

## 2011-06-17 ENCOUNTER — Encounter: Payer: Self-pay | Admitting: Internal Medicine

## 2011-06-17 DIAGNOSIS — I495 Sick sinus syndrome: Secondary | ICD-10-CM

## 2011-07-10 ENCOUNTER — Encounter: Payer: Self-pay | Admitting: Cardiology

## 2011-07-10 ENCOUNTER — Ambulatory Visit (INDEPENDENT_AMBULATORY_CARE_PROVIDER_SITE_OTHER): Payer: Medicare Other | Admitting: Cardiology

## 2011-07-10 VITALS — BP 98/61 | HR 63 | Resp 18

## 2011-07-10 DIAGNOSIS — I5032 Chronic diastolic (congestive) heart failure: Secondary | ICD-10-CM

## 2011-07-10 DIAGNOSIS — I2789 Other specified pulmonary heart diseases: Secondary | ICD-10-CM

## 2011-07-10 DIAGNOSIS — I509 Heart failure, unspecified: Secondary | ICD-10-CM

## 2011-07-10 DIAGNOSIS — I959 Hypotension, unspecified: Secondary | ICD-10-CM

## 2011-07-10 MED ORDER — VERAPAMIL HCL 240 MG PO TBCR: 240.0000 mg | EXTENDED_RELEASE_TABLET | Freq: Every day | ORAL | Status: DC

## 2011-07-10 NOTE — Patient Instructions (Signed)
   Decrease Verapamil to 240mg  daily Your physician wants you to follow up in: 6 months.  You will receive a reminder letter in the mail one-two months in advance.  If you don't receive a letter, please call our office to schedule the follow up appointment

## 2011-07-10 NOTE — Assessment & Plan Note (Signed)
Status post pacemaker implantation with good control of heart rate.

## 2011-07-10 NOTE — Assessment & Plan Note (Signed)
No evidence of volume overload. Continue current medical regimen.  

## 2011-07-10 NOTE — Assessment & Plan Note (Signed)
Patient's oxygen has been increased during the nighttime to 4 L O2 per minute and during the daytime to 2 L per minute.

## 2011-07-10 NOTE — Assessment & Plan Note (Signed)
Stable by physical examination. S2 is audible but this is not a good indicator because of his underlying significant pulmonary hypertension

## 2011-07-10 NOTE — Assessment & Plan Note (Signed)
Decrease verapamil to 240 mg daily.

## 2011-07-10 NOTE — Progress Notes (Signed)
History of present illness:  The patient is an elderly male with a history of chronic lung disease, permanent atrial fibrillation with tachybradycardia syndrome status post pacemaker implantation. The patient has preserved LV function but is not on Coumadin due to significant prior GI bleed and chronic anemia. He also has severe pulmonary hypertension. The patient also has a history of moderate aortic stenosis with an audible murmur on exam Given his multiple comorbidities the patient overall has been doing relatively well. He is limited in his activity and walks with a walker. He has difficulty with his gait due to his underlying Parkinson's disease. His wife states he reports no chest pain and his dyspnea is stable. She has been taking his blood pressure and has noted that his diastolic blood pressure sometimes is in the 40s. Is followed closely by his primary care physician and gets regular blood work for his anemia as well as renal insufficiency. The patient reports no palpitations, presyncope or syncope   Allergies, social history and family history: Reviewed and documented in chart  Past medical history: Reviewed and documented in chart and see problem list below  Medications: Reviewed and listed in chart  Review of systems: Dyspnea stable, no orthopnea PND. No melena hematochezia. Difficulty with balance and gait secondary to Parkinson's. No visual changes

## 2011-07-24 LAB — BASIC METABOLIC PANEL
GFR calc Af Amer: >60
GFR calc non Af Amer: >60
Potassium: 4.1
Sodium: 141

## 2011-07-24 LAB — CBC
HCT: 36.9 — ABNORMAL LOW
Hemoglobin: 12.2 — ABNORMAL LOW
MCHC: 33
MCV: 88.7
RDW: 16 — ABNORMAL HIGH

## 2011-09-09 ENCOUNTER — Encounter: Payer: Self-pay | Admitting: Internal Medicine

## 2011-09-09 DIAGNOSIS — I495 Sick sinus syndrome: Secondary | ICD-10-CM

## 2011-09-11 ENCOUNTER — Telehealth: Payer: Self-pay | Admitting: *Deleted

## 2011-09-11 NOTE — Telephone Encounter (Signed)
Wife (Opal) called stating that Treyvin had been having issues with low blood pressure.  Did have syncopal episode on 12/7 in dining room where they live.  Slumped over & totally was out for a minute or so.  EMS was called.  Wife states that they only checked his blood sugar which was 200 because he had just ate.  Did not check his BP.  Said that she wanted them to take him to ED for evaluation, but they did not think he needed to go.  She has been keeping log of bp readings & HR also.  Advised her to continue to keep log & check immediately after standing or sitting up and log also.  May be orthostatic.  If symptoms worsen or if syncopal episode occurs again, needs to go to ED for evaluation.  She verbalized understanding.

## 2011-09-20 ENCOUNTER — Encounter: Payer: Self-pay | Admitting: Physician Assistant

## 2011-09-20 ENCOUNTER — Ambulatory Visit (INDEPENDENT_AMBULATORY_CARE_PROVIDER_SITE_OTHER): Payer: Medicare Other | Admitting: Cardiology

## 2011-09-20 VITALS — BP 86/56 | HR 70 | Ht 73.0 in | Wt 216.0 lb

## 2011-09-20 DIAGNOSIS — I951 Orthostatic hypotension: Secondary | ICD-10-CM

## 2011-09-20 DIAGNOSIS — I359 Nonrheumatic aortic valve disorder, unspecified: Secondary | ICD-10-CM

## 2011-09-20 DIAGNOSIS — I495 Sick sinus syndrome: Secondary | ICD-10-CM

## 2011-09-20 DIAGNOSIS — R0602 Shortness of breath: Secondary | ICD-10-CM

## 2011-09-20 DIAGNOSIS — I350 Nonrheumatic aortic (valve) stenosis: Secondary | ICD-10-CM

## 2011-09-20 DIAGNOSIS — I5032 Chronic diastolic (congestive) heart failure: Secondary | ICD-10-CM

## 2011-09-20 DIAGNOSIS — I4891 Unspecified atrial fibrillation: Secondary | ICD-10-CM

## 2011-09-20 DIAGNOSIS — R55 Syncope and collapse: Secondary | ICD-10-CM

## 2011-09-20 MED ORDER — POTASSIUM CHLORIDE CRYS ER 20 MEQ PO TBCR: 20.0000 meq | EXTENDED_RELEASE_TABLET | Freq: Every day | ORAL | Status: DC

## 2011-09-20 MED ORDER — TORSEMIDE 20 MG PO TABS: 40.0000 mg | ORAL_TABLET | Freq: Every day | ORAL | Status: DC

## 2011-09-20 MED ORDER — VERAPAMIL HCL 120 MG PO TABS: 120.0000 mg | ORAL_TABLET | Freq: Three times a day (TID) | ORAL | Status: DC

## 2011-09-20 MED ORDER — VERAPAMIL HCL ER 120 MG PO TBCR: 120.0000 mg | EXTENDED_RELEASE_TABLET | Freq: Every day | ORAL | Status: AC

## 2011-09-20 NOTE — Patient Instructions (Signed)
Follow up as scheduled for nurse visit and appointment with Dr. Earnestine Leys. Your physician recommends that you go to the [facility] for lab work: BMET/BNP. DO LABS IN 2 WEEKS. Your physician has requested that you have an echocardiogram. Echocardiography is a painless test that uses sound waves to create images of your heart. It provides your doctor with information about the size and shape of your heart and how well your heart's chambers and valves are working. This procedure takes approximately one hour. There are no restrictions for this procedure. [name] WILL CALL WITH THIS APPT INFORMATION. Decrease Verapamil to 120 mg daily. A new prescription was sent to your pharmacy to reflect this change.  Hold Zaroxolyn and Torsemide for 5 days and then resume Torsemide only at 40 mg daily. This will be 2 of your 20 mg tablets. A new prescription was sent to your pharmacy to reflect this change.  Decrease Potassium to 20 mg daily. A new prescription was sent to your pharmacy to reflect this change.

## 2011-09-21 DIAGNOSIS — I951 Orthostatic hypotension: Secondary | ICD-10-CM | POA: Insufficient documentation

## 2011-09-21 NOTE — Assessment & Plan Note (Signed)
Clearly no evidence of volume overload but I asked the patient's wife to monitor his weight carefully as best as she can give in his physical limitations with his mobility.

## 2011-09-21 NOTE — Progress Notes (Signed)
Alexander Bottoms, MD, [facility] ABIM Board Certified in Adult Cardiovascular Medicine,Internal Medicine and Critical Care Medicine    CC: Followup patient with permanent atrial fibrillation and now with a recent syncopal event.  HPI:  The patient is an 77 year old male with some dementia, chronic lung disease permanent atrial fibrillation with tachybradycardia syndrome and status post pacemaker implantation. The patient has preserved LV function. He is not on Coumadin due to prior history of GI bleed and chronic anemia. He does have severe pulmonary hypertension as well as moderate aortic stenosis with an audible murmur on examination. Is why scheduled an appointment today because the patient passed out 2 weeks ago. She actually called EMS and they checked his blood sugar which was within normal limits. The wife was insistent that they would bring him to the emergency room but EMS thought everything was within normal limits. Apparently the patient was tried to get up and was going from one room to another and suddenly slumped over. Just before that he was sitting in a recliner. Apparently when EMS got there his blood pressure was 90/40. His wife has kept records of her blood pressure readings and she has recorded blood pressures as low as 80 mm of mercury systolic. She actually decreased his torsemide from 60 mg to 20 mg daily. The patient has a relatively poor fluid intake. Today in the office he was quite orthostatic even just going from a lying to a sitting position his blood pressure dropped from 120 mmHg 80 mm of mercury. We did not proceed with having the patient stands. History draw her difficult because of his dementia. He is on chronic oxygen. He reports however no shortness of breath orthopnea PND or palpitations. Reportedly had recent blood work done by Dr. Charm Barges with an increase in his creatinine which led to the recommendation to lower his torsemide until the patient was seen by me in the  office.     PMH: reviewed and listed in Problem List in Electronic Records (and see below) Past Medical History  Diagnosis Date  . Aortic stenosis     Moderate  . Chronic diastolic heart failure   . Mixed hyperlipidemia   . Dementia   . Diabetes mellitus     Insulin-dependent  . Pulmonary hypertension     Severe, oxygen dependent  . Sick sinus syndrome   . Tachy-brady syndrome     s/p Guidant dual-chamber pacemaker  . Atrial fibrillation     Permanent; off Coumadin secondary to GI bleed  . History of echocardiogram     Preserved LV function  . Valvular heart disease     With moderate aortic stenosis  . Chronic anemia     Followed by Dr. Charm Barges, hemoglobin 9.9 (7/10) white count 15.2  . Dyslipidemia   . OSA on CPAP   . History of hospitalization due to heart failure 12/11    For acute and chronic diastoic heart failure and right-sided heart failure symptoms/volume overload   Past Surgical History  Procedure Date  . Pcm 03/26/07    Guidant INSIGNIA-1 - Tachybrady syndrome & A Fib     Allergies/SH/FHX : available in Electronic Records for review and reviewed today  Medications: Current Outpatient Prescriptions  Medication Sig Dispense Refill  . albuterol (PROVENTIL) (2.5 MG/3ML) 0.083% nebulizer solution Take 2.5 mg by nebulization as needed.        Marland Kitchen aspirin 81 MG EC tablet Take 81 mg by mouth daily.        Marland Kitchen  Calcium Carbonate-Vit D-Min 600-400 MG-UNIT TABS Take 1 tablet by mouth daily.        . carbidopa-levodopa (SINEMET) 10-100 MG per tablet Take 1 tablet by mouth 2 (two) times daily.        . carvedilol (COREG) 6.25 MG tablet Take 6.25 mg by mouth 2 (two) times daily.        . colchicine 0.6 MG tablet Take 0.6 mg by mouth daily.        Marland Kitchen donepezil (ARICEPT) 10 MG tablet Take 10 mg by mouth daily.        Marland Kitchen FLUoxetine (PROZAC) 20 MG capsule Take 20 mg by mouth daily.        Marland Kitchen gabapentin (NEURONTIN) 300 MG capsule Take 300 mg by mouth at bedtime.        Marland Kitchen glipiZIDE  (GLUCOTROL) 5 MG tablet Take 5 mg by mouth 2 (two) times daily.        . insulin glargine (LANTUS SOLOSTAR) 100 UNIT/ML injection Inject into the skin as directed.        Marland Kitchen LORazepam (ATIVAN) 0.5 MG tablet Take 0.5 mg by mouth 2 (two) times daily as needed.        . memantine (NAMENDA) 10 MG tablet Take 10 mg by mouth 2 (two) times daily.        . ondansetron (ZOFRAN) 4 MG tablet Take 4 mg by mouth daily as needed. For nausea.       . OXYGEN-HELIUM IN Inhale 2-4 L into the lungs as directed.        . potassium chloride SA (K-DUR,KLOR-CON) 20 MEQ tablet Take 1 tablet (20 mEq total) by mouth daily.  30 tablet  6  . simvastatin (ZOCOR) 40 MG tablet Take 20 mg by mouth at bedtime.        . torsemide (DEMADEX) 20 MG tablet Take 2 tablets (40 mg total) by mouth daily.  60 tablet  6  . verapamil (CALAN-SR) 120 MG CR tablet Take 1 tablet (120 mg total) by mouth at bedtime.  30 tablet  6    ROS: No nausea or vomiting. No fever or chills.No melena or hematochezia.No bleeding.No claudication.  Physical Exam: BP 86/56  Pulse 70  Ht 6\' 1"  (1.854 m)  Wt 216 lb (97.977 kg)  BMI 28.50 kg/m2  SpO2 98% General: Well-nourished white male, difficult historian with features of dementia Neck: Normal carotid upstroke with no carotid bruits. No thyromegaly nonnodular thyroid. JVP was 6 cm in a 45 angle. Lungs: Diminished breath sounds bilaterally Cardiac: Regular rate and rhythm normal S1 somewhat diminished second heart sound in intensity with a 3/6 crescendo decrescendo murmur at the left and right upper sternal border. Murmurs radiating to the left lower sternal border questionable positive Galivardin sign Vascular: No edema. Normal distal pulses bilaterally Skin: Cool and dry  12lead ECG: Single-lead telemetry strip shows rate-controlled atrial fibrillation Limited bedside ECHO: Normal LV systolic function with significantly decreased cusp excursion of aortic valve. However handheld device does not have  continuous wave Doppler and therefore I could not greater degree of aortic stenosis. Visually it appears to be at least moderate.   Assessment and Plan  Counseling was provided regarding the current medical condition and included: . Diagnosis, impressions, prognosis, recommended diagnostic studies  . Risks and benefits of treatment options  . Instructions for management, treatment and/or follow-up care  . Importance of compliance with treatment, risk factor reduction  . Patient and/or family education    Time spent  counseling was 60 minutes and recorded in the Problem List.

## 2011-09-21 NOTE — Assessment & Plan Note (Signed)
Patient has severe orthostatic hypotension. There is clearly an element of dehydration however the patient also has sick sinus syndrome and may not be able to mount a significant tachycardia in response to his orthostatic changes. I will decrease verapamil to 120 mg by mouth daily as the patient does not appear to have any significant tachycardia palpitations. In addition I asked him to hold Zaroxolyn and torsemide for 5 days and then resume torsemide 40 mg daily but without Zaroxolyn. We will have the patient come back for nurse visit in 2 weeks to check his electrolyte panel and BNP level. We will also order orthostatics and see if any further adjustments in medications as needed. 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 I recommended to his wife that she buys G2 and have him drink this. He should not increase his caloric intake and cause problems with his diabetes mellitus.

## 2011-09-21 NOTE — Assessment & Plan Note (Signed)
Rate of atrial fibrillation appears to be controlled. As outlined above we will decrease verapamil. There appears to be normal pacemaker function by recent interrogation. I also do not think that tachyarrhythmias are explanations for the patient's recent episode of syncope. The patient will also followup with me in 4-6 weeks and see if we need to make further medications adjustments.

## 2011-09-21 NOTE — Assessment & Plan Note (Signed)
By bedside echocardiogram the patient has at least moderate aortic stenosis. I scheduled him for a formal echocardiogram to further evaluate this there is a possibility that this is contributing to his symptoms.

## 2011-09-24 ENCOUNTER — Telehealth: Payer: Self-pay | Admitting: Cardiology

## 2011-09-24 NOTE — Telephone Encounter (Signed)
[name] is retaining fluid gained approximately 4 pounds in the last 2 days. Having some shortness of breath.

## 2011-09-24 NOTE — Telephone Encounter (Signed)
Restart Zaroxolyn 5 mg by mouth daily in conjunction with torsemide 40 mg by mouth daily. Monitor weights carefully to further increase in next couple of days patient to call back

## 2011-09-24 NOTE — Telephone Encounter (Signed)
Please advise as patient was just seen on Friday, 12/14.

## 2011-09-24 NOTE — Telephone Encounter (Signed)
[name] called and left a message on my voice mail. She has questions also about the appointment with the Nurse visit next week. Also, need to find out about scheduling the 2 D Echo. Our slots are booked for the rest Of 2012.

## 2011-09-24 NOTE — Telephone Encounter (Signed)
Wife notified of below.

## 2011-09-25 ENCOUNTER — Other Ambulatory Visit (INDEPENDENT_AMBULATORY_CARE_PROVIDER_SITE_OTHER): Payer: Medicare Other | Admitting: *Deleted

## 2011-09-25 ENCOUNTER — Telehealth: Payer: Self-pay | Admitting: *Deleted

## 2011-09-25 DIAGNOSIS — I359 Nonrheumatic aortic valve disorder, unspecified: Secondary | ICD-10-CM

## 2011-09-25 DIAGNOSIS — R0602 Shortness of breath: Secondary | ICD-10-CM

## 2011-09-25 DIAGNOSIS — I350 Nonrheumatic aortic (valve) stenosis: Secondary | ICD-10-CM

## 2011-09-25 DIAGNOSIS — I4891 Unspecified atrial fibrillation: Secondary | ICD-10-CM

## 2011-09-25 NOTE — Telephone Encounter (Signed)
2 D ECHO has been added on for today's schedule Checking percert

## 2011-09-25 NOTE — Telephone Encounter (Signed)
No precert required 

## 2011-09-25 NOTE — Telephone Encounter (Signed)
Will need to keep nurse visit for next week as that is to check orthostatic blood pressure.  Will also schedule Echo at hospital if unable to work in with [facility] today or tomorrow.  Wife verbalized understanding.

## 2011-10-04 ENCOUNTER — Ambulatory Visit (INDEPENDENT_AMBULATORY_CARE_PROVIDER_SITE_OTHER): Payer: Medicare Other | Admitting: *Deleted

## 2011-10-04 ENCOUNTER — Encounter: Payer: Self-pay | Admitting: *Deleted

## 2011-10-04 VITALS — BP 111/74 | HR 76 | Ht 73.0 in | Wt 218.0 lb

## 2011-10-04 DIAGNOSIS — I10 Essential (primary) hypertension: Secondary | ICD-10-CM

## 2011-10-06 NOTE — Progress Notes (Signed)
Still orthostatic but less so. If not too symptomatic would make no med changes. Suspect orthostasis from Parkinson's dx. Difficult to treat.

## 2011-10-09 NOTE — Progress Notes (Signed)
Wife notified of below.  Will keep already scheduled OV for 1/31.

## 2011-10-10 ENCOUNTER — Other Ambulatory Visit: Payer: Self-pay | Admitting: *Deleted

## 2011-10-10 MED ORDER — POTASSIUM CHLORIDE CRYS ER 20 MEQ PO TBCR: 20.0000 meq | EXTENDED_RELEASE_TABLET | Freq: Two times a day (BID) | ORAL | Status: DC

## 2011-11-07 ENCOUNTER — Other Ambulatory Visit: Payer: Self-pay | Admitting: *Deleted

## 2011-11-07 ENCOUNTER — Ambulatory Visit (INDEPENDENT_AMBULATORY_CARE_PROVIDER_SITE_OTHER): Payer: Medicare Other | Admitting: Cardiology

## 2011-11-07 ENCOUNTER — Encounter: Payer: Self-pay | Admitting: Cardiology

## 2011-11-07 VITALS — BP 91/58 | HR 64 | Ht 73.0 in | Wt 215.0 lb

## 2011-11-07 DIAGNOSIS — D72829 Elevated white blood cell count, unspecified: Secondary | ICD-10-CM

## 2011-11-07 DIAGNOSIS — I495 Sick sinus syndrome: Secondary | ICD-10-CM

## 2011-11-07 DIAGNOSIS — I2789 Other specified pulmonary heart diseases: Secondary | ICD-10-CM

## 2011-11-07 DIAGNOSIS — K922 Gastrointestinal hemorrhage, unspecified: Secondary | ICD-10-CM

## 2011-11-07 DIAGNOSIS — R0602 Shortness of breath: Secondary | ICD-10-CM

## 2011-11-07 DIAGNOSIS — I272 Pulmonary hypertension, unspecified: Secondary | ICD-10-CM

## 2011-11-07 DIAGNOSIS — Z950 Presence of cardiac pacemaker: Secondary | ICD-10-CM

## 2011-11-07 DIAGNOSIS — I38 Endocarditis, valve unspecified: Secondary | ICD-10-CM

## 2011-11-07 NOTE — Patient Instructions (Signed)
Your physician recommends that you schedule a follow-up appointment in: 3 months. You will receive a reminder letter in the mail about 1-2 months in advance. If you don't receive this letter, please call our office.  Your physician recommends that you continue on your current medications as directed. Please refer to the Current Medication list given to you today.  Your physician recommends that you return for lab work in: in 4 weeks around February 28th, 2013 at the [facility]. You don't have to fast for this blood work.

## 2011-11-09 ENCOUNTER — Encounter: Payer: Self-pay | Admitting: Cardiology

## 2011-11-09 DIAGNOSIS — I38 Endocarditis, valve unspecified: Secondary | ICD-10-CM | POA: Insufficient documentation

## 2011-11-09 DIAGNOSIS — I495 Sick sinus syndrome: Secondary | ICD-10-CM | POA: Insufficient documentation

## 2011-11-09 DIAGNOSIS — K922 Gastrointestinal hemorrhage, unspecified: Secondary | ICD-10-CM | POA: Insufficient documentation

## 2011-11-09 DIAGNOSIS — I272 Pulmonary hypertension, unspecified: Secondary | ICD-10-CM | POA: Insufficient documentation

## 2011-11-09 DIAGNOSIS — Z950 Presence of cardiac pacemaker: Secondary | ICD-10-CM | POA: Insufficient documentation

## 2011-11-09 NOTE — Progress Notes (Signed)
Alexander Bottoms, MD, [facility]IM Board Certified in Adult Cardiovascular Medicine,Internal Medicine and Critical Care Medicine    [id number] after recent visit for orthostatic hypotension with syncope  HPI:  The patient is an 78 year old male with mild dementia, chronic lung disease, permanent atrial fibrillation with tachybradycardia syndrome status post pacemaker implantation.  He also has significant valvular disease including aortic stenosis and mitral regurgitation as well as severe pulmonary hypertension.  He has permanent atrial fibrillation but is not a Coumadin candidate secondary to prior history of GI bleeding with severe anemia. He recently presented with significant orthostatic hypotension and adjustments were made in his diuretic regimen.  We also scheduled a followup echocardiogram which demonstrates moderate to severe aortic stenosis and moderate mitral regurgitation, albeit with preserved LV function. We initially held his diuretic and then resume at lower dose.  Followup laboratory work is now stable with a BNP level is moderately elevated, but without clinical symptoms of heart failure at rest.  His potassium is also stable and his creatinine has stabilized around 1.35.  He now takes a lower dose of torsemide, but is back on his low dose Zaroxolyn 2.5 mg daily. His wife monitors his weight daily and has been stable around 209-210 pounds. He reports no recurrent orthostatic symptoms and no recurrent syncope.  His blood pressure also has been stable per his wife.  He denies any chest pain, orthopnea or PND.  PMH: reviewed and listed in Problem List in Electronic Records (and see below) Past Medical History  Diagnosis Date  . Aortic stenosis     moderate to severe aortic stenosis by echocardiogram December 2012  . Chronic diastolic heart failure   . Mixed hyperlipidemia   . Dementia   . Diabetes mellitus     Insulin-dependent  . Pulmonary hypertension     Severe, oxygen  dependent#65-70 mmHg most recently by echocardiogram December 2012.  . Sick sinus syndrome   . Tachy-brady syndrome     s/p Guidant dual-chamber pacemaker  . Atrial fibrillation     Permanent; off Coumadin secondary to GI bleed  . History of echocardiogram     Preserved LV function  . Valvular heart disease     moderate mitral regurgitation  . Chronic anemia     Followed by Dr. Charm Barges, hemoglobin 9.9 (7/10) white count 15.2  . Dyslipidemia   . OSA on CPAP   . History of hospitalization due to heart failure 12/11    For acute and chronic diastoic heart failure and right-sided heart failure symptoms/volume overload  . Orthostatic hypotension   . GI bleeding     Coumadin discontinued  . Pacemaker    Past Surgical History  Procedure Date  . Pcm 03/26/07    Guidant INSIGNIA-1 - Tachybrady syndrome & A Fib    Allergies/SH/FHX : available in Electronic Records for review  Allergies  Allergen Reactions  . Allopurinol   . Penicillins    History   Social History  . Marital Status: Married    Spouse Name: N/A    Number of Children: N/A  . Years of Education: N/A   Occupational History  . Retired    Social History Main Topics  . Smoking status: Former Smoker -- 2.0 packs/day for 30 years    Types: Cigarettes    Quit date: 10/08/1970  . Smokeless tobacco: Never Used   Comment: Tobacco use-no  . Alcohol Use: No  . Drug Use: No  . Sexually Active: Not on file  Other Topics Concern  . Not on file   Social History Narrative  . No narrative on file   Family History  Problem Relation Age of Onset  . Coronary artery disease Neg Hx   . Diabetes Neg Hx   . Hypertension Neg Hx     Medications: Current Outpatient Prescriptions  Medication Sig Dispense Refill  . albuterol (PROVENTIL) (2.5 MG/3ML) 0.083% nebulizer solution Take 2.5 mg by nebulization as needed.        Marland Kitchen aspirin 81 MG EC tablet Take 81 mg by mouth daily.        . Calcium Carbonate-Vit D-Min 600-400  MG-UNIT TABS Take 1 tablet by mouth daily.        . carbidopa-levodopa (SINEMET) 10-100 MG per tablet Take 1 tablet by mouth 2 (two) times daily.        . carvedilol (COREG) 6.25 MG tablet Take 6.25 mg by mouth 2 (two) times daily.        . colchicine 0.6 MG tablet Take 0.6 mg by mouth daily.        Marland Kitchen donepezil (ARICEPT) 10 MG tablet Take 10 mg by mouth daily.        Marland Kitchen FLUoxetine (PROZAC) 20 MG capsule Take 20 mg by mouth daily.        Marland Kitchen gabapentin (NEURONTIN) 300 MG capsule Take 300 mg by mouth at bedtime.        Marland Kitchen glipiZIDE (GLUCOTROL) 5 MG tablet Take 5 mg by mouth 2 (two) times daily.        . insulin glargine (LANTUS SOLOSTAR) 100 UNIT/ML injection Inject into the skin as directed.        Marland Kitchen LORazepam (ATIVAN) 0.5 MG tablet Take 0.5 mg by mouth 2 (two) times daily as needed.        . memantine (NAMENDA) 10 MG tablet Take 10 mg by mouth 2 (two) times daily.        . metolazone (ZAROXOLYN) 2.5 MG tablet Take 2.5 mg by mouth daily.      . ondansetron (ZOFRAN) 4 MG tablet Take 4 mg by mouth daily as needed. For nausea.       . OXYGEN-HELIUM IN Inhale 2-4 L into the lungs as directed.        . potassium chloride SA (K-DUR,KLOR-CON) 20 MEQ tablet Take 1 tablet (20 mEq total) by mouth 2 (two) times daily.      . simvastatin (ZOCOR) 40 MG tablet Take 20 mg by mouth at bedtime.        . torsemide (DEMADEX) 20 MG tablet Take 2 tablets (40 mg total) by mouth daily.  60 tablet  6  . verapamil (CALAN-SR) 120 MG CR tablet Take 1 tablet (120 mg total) by mouth at bedtime.  30 tablet  6    ROS: No nausea or vomiting. No fever or chills.No melena or hematochezia.No bleeding.No claudication  Physical Exam: BP 91/58  Pulse 64  Ht 6\' 1"  (1.854 m)  Wt 215 lb (97.523 kg)  BMI 28.37 kg/m2  SpO2 98% General:overweight white male sitting in a wheelchair.  Using oxygen Neck:normal carotid upstroke.  No carotid bruit.  No thyromegaly.  Nonnodular thyroid.  JVP is approximate 6 cm sitting upright in a 90  position Lungs:clear breath sounds bilaterally without wheezing Cardiac:irregular rate and rhythm with normal S1 and decreased intensity of S2, 3/6 crescendo decrescendo murmur right upper sternal border and a 2/6 holosystolic murmur towards the apex. Vascular:no edema.  Normal distal pulses bilaterally  Skin:warm and dry Physcologic:somewhat depressed affect, but answering questions appropriately  12lead ECG:not performed Limited bedside ECHO:N/A   Patient Active Problem List  Diagnoses  . HYPERLIPIDEMIA-MIXED  . GOUT, UNSPECIFIED  . ANEMIA  . LEUKOCYTOSIS  . OBSTRUCTIVE SLEEP APNEA  . HYPERTENSION, PULMONARY  . Aortic stenosis-moderate to severe  . ATRIAL FIBRILLATION  . SICK SINUS/ TACHY-BRADY SYNDROME  . DIASTOLIC HEART FAILURE, CHRONIC-stable  . DIZZINESS  . SHORTNESS OF BREATH-COPD and pulmonary hypertension on chronic oxygen therapy.  Marland Kitchen PACEMAKER, PERMANENT  . Orthostatic hypotension-stable post adjustments of diuretics, the associated with syncope  . GI bleeding-not Coumadin candidate  . Valvular heart disease-moderate mitral regurgitation and moderate to severe aortic stenosis  . Tachy-brady syndrome  . Pulmonary hypertension-severe    PLAN    The patient has improved after adjusting his diuretics.  His creatinine also stabilized at 1.35  He now takes a lower dose of torsemide in addition with his Zaroxolyn.  I asked his wife to continue to monitor his weight.  She will adjust diuretics based on excessive increase in weight.  He has had no recurrent syncope which was related to orthostatic hypotension  We reviewed today The echocardiogram as well as all his laboratory work.  I have made no adjustment in his medications.  We will continue to hold off Coumadin, and although the patient has severe valvular heart disease.  He is not a candidate for any further intervention, this would include TAVR.  I do not think this is an option.  We will continue to manage the  patient medically.

## 2011-12-09 ENCOUNTER — Encounter: Payer: Self-pay | Admitting: *Deleted

## 2011-12-09 ENCOUNTER — Other Ambulatory Visit: Payer: Self-pay | Admitting: *Deleted

## 2011-12-09 MED ORDER — POTASSIUM CHLORIDE CRYS ER 20 MEQ PO TBCR: 20.0000 meq | EXTENDED_RELEASE_TABLET | Freq: Two times a day (BID) | ORAL | Status: DC

## 2011-12-10 ENCOUNTER — Encounter: Payer: Self-pay | Admitting: Internal Medicine

## 2011-12-10 DIAGNOSIS — I495 Sick sinus syndrome: Secondary | ICD-10-CM

## 2012-01-15 ENCOUNTER — Encounter: Payer: Self-pay | Admitting: Internal Medicine

## 2012-01-15 ENCOUNTER — Ambulatory Visit (INDEPENDENT_AMBULATORY_CARE_PROVIDER_SITE_OTHER): Payer: Medicare Other | Admitting: Internal Medicine

## 2012-01-15 VITALS — BP 111/74 | HR 79 | Ht 73.0 in | Wt 215.0 lb

## 2012-01-15 DIAGNOSIS — I495 Sick sinus syndrome: Secondary | ICD-10-CM

## 2012-01-15 DIAGNOSIS — I4891 Unspecified atrial fibrillation: Secondary | ICD-10-CM

## 2012-01-15 LAB — PACEMAKER DEVICE OBSERVATION
BMOD-0002RV: 8
BMOD-0003RV: 30
BMOD-0004RV: 2
RV LEAD AMPLITUDE: 7.8 mv
RV LEAD IMPEDENCE PM: 520 Ohm
RV LEAD THRESHOLD: 0.7 V
VENTRICULAR PACING PM: 80

## 2012-01-15 NOTE — Assessment & Plan Note (Signed)
Rate controlled permanent afib Not felt to be a coumadin candidate due to GI bleeding Not a candidate for novel anticoagulants including eliquis due to valvular afib.

## 2012-01-15 NOTE — Patient Instructions (Signed)
Your physician you to follow up in 1 year. You will receive a reminder letter in the mail one-two months in advance. If you don't receive a letter, please call our office to schedule the follow-up appointment. Your physician recommends that you continue on your current medications as directed. Please refer to the Current Medication list given to you today. 

## 2012-01-15 NOTE — Assessment & Plan Note (Signed)
Normal pacemaker function See Pace Art report No changes today  

## 2012-01-15 NOTE — Progress Notes (Signed)
PCP:  Alcide Evener, DO Primary Cardiologist:  Dr Andee Lineman  The patient presents today for routine electrophysiology followup.  He has oxygen dependent COPD as well as severe valvular heart disease.  He is followed closely by Dr Andee Lineman.  He also has permanent afib and PPM for bradycardia.  He is not a coumadin candidate due to prior GI bleeding.  Today, he denies symptoms of palpitations, chest pain, shortness of breath (above baseline), lower extremity edema,  presyncope, syncope, or neurologic sequela.  The patient feels that he is tolerating medications without difficulties and is otherwise without complaint today.   Past Medical History  Diagnosis Date  . Aortic stenosis     moderate to severe aortic stenosis by echocardiogram December 2012  . Chronic diastolic heart failure   . Mixed hyperlipidemia   . Dementia   . Diabetes mellitus     Insulin-dependent  . Pulmonary hypertension     Severe, oxygen dependent#65-70 mmHg most recently by echocardiogram December 2012.  . Sick sinus syndrome   . Tachy-brady syndrome     s/p Guidant dual-chamber pacemaker  . Atrial fibrillation     Permanent; off Coumadin secondary to GI bleed  . History of echocardiogram     Preserved LV function  . Valvular heart disease     moderate mitral regurgitation  . Chronic anemia     Followed by Dr. Charm Barges, hemoglobin 9.9 (7/10) white count 15.2  . Dyslipidemia   . OSA on CPAP   . History of hospitalization due to heart failure 12/11    For acute and chronic diastoic heart failure and right-sided heart failure symptoms/volume overload  . Orthostatic hypotension   . GI bleeding     Coumadin discontinued  . Pacemaker    Past Surgical History  Procedure Date  . Pcm 03/26/07    Guidant INSIGNIA-1 - Tachybrady syndrome & A Fib    Current Outpatient Prescriptions  Medication Sig Dispense Refill  . albuterol (PROVENTIL) (2.5 MG/3ML) 0.083% nebulizer solution Take 2.5 mg by nebulization as needed.         Marland Kitchen aspirin 81 MG EC tablet Take 81 mg by mouth daily.        . Calcium Carbonate-Vit D-Min 600-400 MG-UNIT TABS Take 1 tablet by mouth daily.        . carbidopa-levodopa (SINEMET) 10-100 MG per tablet Take 1 tablet by mouth 2 (two) times daily.        . carvedilol (COREG) 6.25 MG tablet Take 6.25 mg by mouth 2 (two) times daily.        . colchicine 0.6 MG tablet Take 0.6 mg by mouth daily.        Marland Kitchen donepezil (ARICEPT) 10 MG tablet Take 10 mg by mouth daily.        Marland Kitchen FLUoxetine (PROZAC) 20 MG capsule Take 20 mg by mouth daily.        Marland Kitchen gabapentin (NEURONTIN) 300 MG capsule Take 300 mg by mouth at bedtime.        Marland Kitchen glipiZIDE (GLUCOTROL) 5 MG tablet Take 5 mg by mouth 2 (two) times daily.        . insulin glargine (LANTUS SOLOSTAR) 100 UNIT/ML injection Inject into the skin as directed.        Marland Kitchen LORazepam (ATIVAN) 0.5 MG tablet Take 0.5 mg by mouth 2 (two) times daily as needed.        . memantine (NAMENDA) 10 MG tablet Take 10 mg by mouth 2 (two) times daily.        Marland Kitchen  metolazone (ZAROXOLYN) 2.5 MG tablet Take 2.5 mg by mouth daily.      . ondansetron (ZOFRAN) 4 MG tablet Take 4 mg by mouth daily as needed. For nausea.       . OXYGEN-HELIUM IN Inhale 2-4 L into the lungs as directed.        . potassium chloride SA (K-DUR,KLOR-CON) 20 MEQ tablet Take 1 tablet (20 mEq total) by mouth 2 (two) times daily.  60 tablet  6  . simvastatin (ZOCOR) 40 MG tablet Take 20 mg by mouth at bedtime.        . torsemide (DEMADEX) 20 MG tablet Take 2 tablets (40 mg total) by mouth daily.  60 tablet  6  . verapamil (CALAN-SR) 120 MG CR tablet Take 1 tablet (120 mg total) by mouth at bedtime.  30 tablet  6    Allergies  Allergen Reactions  . Allopurinol   . Penicillins     History   Social History  . Marital Status: Married    Spouse Name: N/A    Number of Children: N/A  . Years of Education: N/A   Occupational History  . Retired    Social History Main Topics  . Smoking status: Former Smoker -- 2.0  packs/day for 30 years    Types: Cigarettes    Quit date: 10/08/1970  . Smokeless tobacco: Never Used   Comment: Tobacco use-no  . Alcohol Use: No  . Drug Use: No  . Sexually Active: Not on file   Other Topics Concern  . Not on file   Social History Narrative  . No narrative on file    Family History  Problem Relation Age of Onset  . Coronary artery disease Neg Hx   . Diabetes Neg Hx   . Hypertension Neg Hx     Physical Exam: Filed Vitals:   01/15/12 0944  BP: 111/74  Pulse: 79  Height: 6\' 1"  (1.854 m)  Weight: 215 lb (97.523 kg)  SpO2: 95%    GEN- The patient is chronically ill appearing, alert and oriented x 3 today.   Head- normocephalic, atraumatic Neck- supple, no JVP Lungs- decreased BS, normal work of breathing Chest- pacemaker pocket is well healed Heart- Regular rate and rhythm (paced), late peaking 2/6 SEM LUSB GI- soft, NT, ND, + BS Extremities- no clubbing, cyanosis, or edema  Pacemaker interrogation- reviewed in detail today,  See PACEART report  Assessment and Plan:

## 2012-03-12 ENCOUNTER — Ambulatory Visit (INDEPENDENT_AMBULATORY_CARE_PROVIDER_SITE_OTHER): Payer: Medicare Other | Admitting: Cardiology

## 2012-03-12 ENCOUNTER — Encounter: Payer: Self-pay | Admitting: Cardiology

## 2012-03-12 VITALS — BP 97/62 | HR 78 | Ht 73.0 in | Wt 213.0 lb

## 2012-03-12 DIAGNOSIS — R0602 Shortness of breath: Secondary | ICD-10-CM

## 2012-03-12 DIAGNOSIS — I350 Nonrheumatic aortic (valve) stenosis: Secondary | ICD-10-CM

## 2012-03-12 DIAGNOSIS — I4891 Unspecified atrial fibrillation: Secondary | ICD-10-CM

## 2012-03-12 DIAGNOSIS — R42 Dizziness and giddiness: Secondary | ICD-10-CM

## 2012-03-12 DIAGNOSIS — I951 Orthostatic hypotension: Secondary | ICD-10-CM

## 2012-03-12 DIAGNOSIS — I359 Nonrheumatic aortic valve disorder, unspecified: Secondary | ICD-10-CM

## 2012-03-12 DIAGNOSIS — I38 Endocarditis, valve unspecified: Secondary | ICD-10-CM

## 2012-03-12 NOTE — Patient Instructions (Signed)
   Hold the following medications x 5 days only, then resume previous dosing - Torsemide, Zaroxolyn, & K-Dur.   Primary MD following labs Follow up in  3 months

## 2012-03-16 ENCOUNTER — Telehealth: Payer: Self-pay | Admitting: *Deleted

## 2012-03-16 NOTE — Telephone Encounter (Signed)
HH nurse reporting weight gain of 8 lbs since stopping fluid med.  Does have a little more SOB with exertion than usual.  Looking puffy in face and abdomen.  States wife did give him extra dose of Metolazone.

## 2012-03-17 ENCOUNTER — Encounter: Payer: Self-pay | Admitting: Cardiology

## 2012-03-17 ENCOUNTER — Other Ambulatory Visit: Payer: Self-pay | Admitting: *Deleted

## 2012-03-17 ENCOUNTER — Other Ambulatory Visit: Payer: Self-pay | Admitting: Cardiology

## 2012-03-17 DIAGNOSIS — I509 Heart failure, unspecified: Secondary | ICD-10-CM

## 2012-03-17 NOTE — Telephone Encounter (Signed)
Tara with [facility] checking to see if we have gotten response from MD.  Discussed below with Dr. Andee Lineman.   MD advises he come to ED for evaluation & he will come over & see today to decide if he just needs IV Lasix and/or admission.  [facility] nurse will notify patient.  [facility] ED notified & office notes sent.

## 2012-03-18 ENCOUNTER — Encounter: Payer: Self-pay | Admitting: Internal Medicine

## 2012-03-18 ENCOUNTER — Encounter: Payer: Self-pay | Admitting: Cardiology

## 2012-03-18 DIAGNOSIS — I509 Heart failure, unspecified: Secondary | ICD-10-CM

## 2012-03-18 DIAGNOSIS — I5033 Acute on chronic diastolic (congestive) heart failure: Secondary | ICD-10-CM

## 2012-03-18 DIAGNOSIS — I359 Nonrheumatic aortic valve disorder, unspecified: Secondary | ICD-10-CM

## 2012-03-20 DIAGNOSIS — I5033 Acute on chronic diastolic (congestive) heart failure: Secondary | ICD-10-CM

## 2012-03-27 ENCOUNTER — Ambulatory Visit (INDEPENDENT_AMBULATORY_CARE_PROVIDER_SITE_OTHER): Payer: Medicare Other | Admitting: Cardiology

## 2012-03-27 ENCOUNTER — Encounter: Payer: Self-pay | Admitting: Cardiology

## 2012-03-27 VITALS — BP 110/68 | HR 73 | Resp 18 | Ht 73.0 in | Wt 215.0 lb

## 2012-03-27 DIAGNOSIS — I4891 Unspecified atrial fibrillation: Secondary | ICD-10-CM

## 2012-03-27 DIAGNOSIS — D649 Anemia, unspecified: Secondary | ICD-10-CM

## 2012-03-27 DIAGNOSIS — I359 Nonrheumatic aortic valve disorder, unspecified: Secondary | ICD-10-CM

## 2012-03-27 DIAGNOSIS — R0602 Shortness of breath: Secondary | ICD-10-CM

## 2012-03-27 DIAGNOSIS — I350 Nonrheumatic aortic (valve) stenosis: Secondary | ICD-10-CM

## 2012-03-27 DIAGNOSIS — K922 Gastrointestinal hemorrhage, unspecified: Secondary | ICD-10-CM

## 2012-03-27 DIAGNOSIS — I5032 Chronic diastolic (congestive) heart failure: Secondary | ICD-10-CM

## 2012-03-27 DIAGNOSIS — I38 Endocarditis, valve unspecified: Secondary | ICD-10-CM

## 2012-03-27 DIAGNOSIS — N189 Chronic kidney disease, unspecified: Secondary | ICD-10-CM

## 2012-03-27 DIAGNOSIS — Z950 Presence of cardiac pacemaker: Secondary | ICD-10-CM

## 2012-03-27 DIAGNOSIS — R42 Dizziness and giddiness: Secondary | ICD-10-CM

## 2012-03-27 LAB — PULMONARY FUNCTION TEST

## 2012-03-27 NOTE — Assessment & Plan Note (Signed)
Although the patient has known COPD, looking back through the chart he had normal pulmonary function test in 2007 and already had significant valvular heart disease and was oxygen dependent. Therefore I sent the patient today for pulmonary function studies as well as an ABG. He is currently on 3-4 L of O2. The patient may need a right heart catheterization prior to any decision of TAVR. Additionally the patient will also require left heart catheterization and assessment of his coronary arteries, in the event there is any consideration for TAVR . All these decisions will be made in the valve clinic. At the present time I've made no changes in the patient's diuretic regimen, he appears to be relatively euvolemic.

## 2012-03-27 NOTE — Assessment & Plan Note (Signed)
Hemoglobin stable during recent admission. Hemoglobin 11.0 MCV 97

## 2012-03-27 NOTE — Assessment & Plan Note (Addendum)
Echocardiogram during recent hospitalization. Mean pressure gradient 33 mm of mercury peak instantaneous gradient 59 mmHg calculated valve area 0.99 cm. The patient currently walks fine line in regards to managing his diuretics to avoid overdiuresis and yet at the same time avoiding pulmonary edema. Most recently he was admitted for congestive heart failure and required additional diuresis. However in the past we have run him dry and has had problems with orthostatic hypotension and dizziness. Although the patient has multiple medical problems at this point short of close surveillance and up and down in his diuretic treatment we have very little to offer long-term. The patient's fall for his lung disease by Dr. Orson Aloe but we don't have any recent assessment of pulmonary function test. I told the patient that we could refer him to the structural heart disease clinic at [facility] with Dr. Excell Seltzer and make an assessment for possible TAVR. I let the patient watch an instruction video to get together with his wife. They're comfortable with the risks and his wife want to be very aggressive with his care, if at all symptomatically he would improve. I told her that even in the best of circumstances valve replacement  may provide the patient less shortness of breath and decrease his hospitalizations and make them less prone to symptoms of orthostasis and may be more manageable diuretic treatment regimen.. She understands that it's unlikely that he will have a significant functional recovery although I will leave this to Dr. Excell Seltzer and Dr. Cornelius Moras to the side. The patient and his wife were very grateful for referral to structural heart disease clinic. I will send the patient for pulmonary function test and an ABG today. Of note is that he does have significant pulmonary hypertension with a recent echocardiogram PA pressures of approximately 55-60 mm of mercury. He also has moderate to severe tricuspid  regurgitation.

## 2012-03-27 NOTE — Assessment & Plan Note (Signed)
Atrial fibrillation with controlled rate. Patient previously felt to be a Coumadin candidate due to her history of GI bleeding.

## 2012-03-27 NOTE — Assessment & Plan Note (Signed)
Creatinine has remained stable around 1.6.

## 2012-03-27 NOTE — Assessment & Plan Note (Signed)
On most recent echocardiogram there was still moderate mitral regurgitation. The patient has multi-valvular heart disease and again based on this may not be a good candidate for TAVR in addition to his multiple medical comorbidities, but again I would not exclude the patient is somewhat like a second opinion and the patient's wife want to be very aggressive in his care.

## 2012-03-27 NOTE — Assessment & Plan Note (Signed)
Normal pacemaker function.

## 2012-03-27 NOTE — Patient Instructions (Addendum)
   PFT - today  Referral to valve clinic with Dr. Johnnette Gourd - [facility] in 7-10 days  Our office will contact with results  Compression stockings Follow up in  3-4 weeks after valve clinic

## 2012-03-27 NOTE — Assessment & Plan Note (Addendum)
Ejection fraction by last echocardiogram was 60-65 mm of mercury. Unable to assess diastolic parameters by tissue Doppler due to atrial fibrillation.

## 2012-03-27 NOTE — Assessment & Plan Note (Signed)
No recurrence stable 

## 2012-03-27 NOTE — Assessment & Plan Note (Signed)
Chronic orthostatic dizziness. This is been difficult to manage. Although the patient still has some dizziness when getting up he is severely deconditioned and there is actually an element of dysautonomia involved.

## 2012-03-27 NOTE — Progress Notes (Signed)
Peyton Bottoms, MD, [facility] Board Certified in Adult Cardiovascular Medicine,Internal Medicine and Critical Care Medicine    CC: Followup patient after recent hospitalization for acute on chronic diastolic heart failure  HPI:  Mr. Corpus was recently admitted with audible overload in the setting of chronic diastolic heart failure as well as renal insufficiency. His volume status has been difficult to manage due to concomitant severe aortic stenosis. He walks a fine line between volume overload and dehydration and drop in blood pressure because of the latter. In addition he has ongoing symptoms of orthostatic dizziness, which have been long-standing and in particular largely related to dysautonomia and deconditioning. He has multiple medical problems including severe aortic stenosis, multivalve alert heart disease with moderate mitral and tricuspid regurgitation. He is dependent on chronic O2 currently at 3-4 L with a history of COPD and moderate to severe pulmonary hypertension. However reviewing the chart his last pulmonary function test were 6 years ago and were actually within normal limits. The patient also the never had a cardiac catheterization and has no history of coronary artery disease. His LV function has remained normal over the years I have been treating him. He does have a history in the past of GI bleeding which required [place] to stop Coumadin which she was taking for chronic atrial fibrillation. His hemoglobin however has now remained stable, including his most recent hospitalization. The patient feels improved. He has less shortness of breath when sitting and lying in bed. He has an aid who is present for 7-8 hours a day. She assists him with minor daily activities, limited to walking from the bed to the bathroom and moving around in the house. He also needs assistance of a walker. Most of the day he spent the remainder or sitting in a recliner chair. He continues to have dizziness upon  standing associated with mild nausea. He also short of breath on minimal exertion. He denies any substernal chest pain. He has no orthopnea PND. He actually looks much better than during his last clinic visit.   PMH: reviewed and listed in Problem List in Electronic Records (and see below) Past Medical History  Diagnosis Date  . Aortic stenosis     Severe aortic stenosis mean pressure gradient 33 mm of mercury  . Chronic diastolic heart failure     Multiple hospital admissions for  . Mixed hyperlipidemia   . Dementia   . Diabetes mellitus     Insulin-dependent  . Pulmonary hypertension     Severe, oxygen dependent#65-70 mmHg most recently by echocardiogram December 2012.  . Sick sinus syndrome   . Tachy-brady syndrome     s/p Guidant dual-chamber pacemaker  . Atrial fibrillation     Permanent; off Coumadin secondary to GI bleed  . History of echocardiogram     Preserved LV function  . Valvular heart disease     moderate mitral regurgitation  . Chronic anemia     Followed by Dr. Charm Barges, hemoglobin 9.9 (7/10) white count 15.2  . Dyslipidemia   . OSA on CPAP   . History of hospitalization due to heart failure 12/11    For acute and chronic diastoic heart failure and right-sided heart failure symptoms/volume overload  . Orthostatic hypotension   . GI bleeding     Coumadin discontinued  . Pacemaker    Past Surgical History  Procedure Date  . Pcm 03/26/07    Guidant INSIGNIA-1 - Tachybrady syndrome & A Fib    Allergies/SH/FHX :  available in Electronic Records for review  Allergies  Allergen Reactions  . Allopurinol   . Penicillins    History   Social History  . Marital Status: Married    Spouse Name: N/A    Number of Children: N/A  . Years of Education: N/A   Occupational History  . Retired    Social History Main Topics  . Smoking status: Former Smoker -- 2.0 packs/day for 30 years    Types: Cigarettes    Quit date: 10/08/1970  . Smokeless tobacco: Never Used     Comment: Tobacco use-no  . Alcohol Use: No  . Drug Use: No  . Sexually Active: Not on file   Other Topics Concern  . Not on file   Social History Narrative  . No narrative on file   Family History  Problem Relation Age of Onset  . Coronary artery disease Neg Hx   . Diabetes Neg Hx   . Hypertension Neg Hx     Medications: Current Outpatient Prescriptions  Medication Sig Dispense Refill  . albuterol (PROVENTIL) (2.5 MG/3ML) 0.083% nebulizer solution Take 2.5 mg by nebulization as needed.        Marland Kitchen aspirin 81 MG EC tablet Take 81 mg by mouth daily.        . Calcium Carbonate-Vit D-Min 600-400 MG-UNIT TABS Take 1 tablet by mouth daily.        . carbidopa-levodopa (SINEMET) 10-100 MG per tablet Take 1 tablet by mouth 2 (two) times daily.        . carvedilol (COREG) 6.25 MG tablet Take 6.25 mg by mouth 2 (two) times daily.        . colchicine 0.6 MG tablet Take 0.6 mg by mouth daily.        Marland Kitchen donepezil (ARICEPT) 10 MG tablet Take 10 mg by mouth daily.        Marland Kitchen FLUoxetine (PROZAC) 40 MG capsule Take 40 mg by mouth daily.      Marland Kitchen gabapentin (NEURONTIN) 300 MG capsule Take 300 mg by mouth at bedtime.        Marland Kitchen glipiZIDE (GLUCOTROL) 5 MG tablet Take 5 mg by mouth 2 (two) times daily.        . insulin glargine (LANTUS SOLOSTAR) 100 UNIT/ML injection Inject into the skin as directed.        Marland Kitchen LORazepam (ATIVAN) 0.5 MG tablet Take 0.5 mg by mouth 2 (two) times daily as needed.        . memantine (NAMENDA) 10 MG tablet Take 10 mg by mouth 2 (two) times daily.        . metolazone (ZAROXOLYN) 2.5 MG tablet Take 2.5 mg by mouth daily.      . ondansetron (ZOFRAN) 4 MG tablet Take 4 mg by mouth daily as needed. For nausea.       . OXYGEN-HELIUM IN Inhale 2-4 L into the lungs as directed.        . potassium chloride SA (K-DUR,KLOR-CON) 20 MEQ tablet Take 20 mEq by mouth 2 (two) times daily.       . simvastatin (ZOCOR) 40 MG tablet Take 20 mg by mouth at bedtime.        . torsemide (DEMADEX) 20 MG  tablet Take 20 mg by mouth daily. Alternate with 40mg  qod      . verapamil (CALAN-SR) 120 MG CR tablet Take 1 tablet (120 mg total) by mouth at bedtime.  30 tablet  6    ROS: No nausea  or vomiting. No fever or chills.No melena or hematochezia.No bleeding.No claudication  Physical Exam: BP 110/68  Pulse 73  Resp 18  Ht 6\' 1"  (1.854 m)  Wt 215 lb (97.523 kg)  BMI 28.37 kg/m2  SpO2 93% General: The patient is awake and alert and appears much improved since his last clinic visit. He sitting comfortably in the chair Neck: Normal carotid upstroke no carotid bruits bilaterally. No thyromegaly nonnodular thyroid. JVP is approximately 7-8 cm in a 80 angle Lungs: Diminished breath sounds bilaterally faint crackles throughout. No wheezing Cardiac: Irregular rate and rhythm with normal S1 but muffled second heart sound there is a 3-4/6 crescendo decrescendo murmur at the right upper sternal border radiating along the left lower sternal border consistent with a Galivardin  Vascular: No edema. Normal distal pulses posterior tibial and dorsalis pedis. Skin: Warm and dry multiple skin tags in a few areas of ecchymoses Physcologic: Normal affect  12lead ECG: Not performed Limited bedside ECHO:N/A No images are attached to the encounter.   Assessment and Plan  ANEMIA Hemoglobin stable during recent admission. Hemoglobin 11.0 MCV 97  Aortic stenosis Echocardiogram during recent hospitalization. Mean pressure gradient 33 mm of mercury peak instantaneous gradient 59 mmHg calculated valve area 0.99 cm. The patient currently walks fine line in regards to managing his diuretics to avoid overdiuresis and yet at the same time avoiding pulmonary edema. Most recently he was admitted for congestive heart failure and required additional diuresis. However in the past we have run him dry and has had problems with orthostatic hypotension and dizziness. Although the patient has multiple medical problems at this  point short of close surveillance and up and down in his diuretic treatment we have very little to offer long-term. The patient's fall for his lung disease by Dr. Orson Aloe but we don't have any recent assessment of pulmonary function test. I told the patient that we could refer him to the structural heart disease clinic at [facility] with Dr. Excell Seltzer and make an assessment for possible TAVR. I let the patient watch an instruction video to get together with his wife. They're comfortable with the risks and his wife want to be very aggressive with his care, if at all symptomatically he would improve. I told her that even in the best of circumstances valve replacement  may provide the patient less shortness of breath and decrease his hospitalizations and make them less prone to symptoms of orthostasis and may be more manageable diuretic treatment regimen.. She understands that it's unlikely that he will have a significant functional recovery although I will leave this to Dr. Excell Seltzer and Dr. Cornelius Moras to the side. The patient and his wife were very grateful for referral to structural heart disease clinic. I will send the patient for pulmonary function test and an ABG today. Of note is that he does have significant pulmonary hypertension with a recent echocardiogram PA pressures of approximately 55-60 mm of mercury. He also has moderate to severe tricuspid regurgitation.  DIASTOLIC HEART FAILURE, CHRONIC Ejection fraction by last echocardiogram was 60-65 mm of mercury. Unable to assess diastolic parameters by tissue Doppler due to atrial fibrillation.  Shortness of breath Although the patient has known COPD, looking back through the chart he had normal pulmonary function test in 2007 and already had significant valvular heart disease and was oxygen dependent. Therefore I sent the patient today for pulmonary function studies as well as an ABG. He is currently on 3-4 L of O2. The patient may  need a right heart  catheterization prior to any decision of TAVR. Additionally the patient will also require left heart catheterization and assessment of his coronary arteries, in the event there is any consideration for TAVR . All these decisions will be made in the valve clinic. At the present time I've made no changes in the patient's diuretic regimen, he appears to be relatively euvolemic.  PACEMAKER, PERMANENT Normal pacemaker function  ATRIAL FIBRILLATION Atrial fibrillation with controlled rate. Patient previously felt to be a Coumadin candidate due to her history of GI bleeding.  GI bleeding No recurrence stable  DIZZINESS Chronic orthostatic dizziness. This is been difficult to manage. Although the patient still has some dizziness when getting up he is severely deconditioned and there is actually an element of dysautonomia involved.  Valvular heart disease On most recent echocardiogram there was still moderate mitral regurgitation. The patient has multi-valvular heart disease and again based on this may not be a good candidate for TAVR in addition to his multiple medical comorbidities, but again I would not exclude the patient is somewhat like a second opinion and the patient's wife want to be very aggressive in his care.  Chronic renal failure Creatinine has remained stable around 1.6.   Patient Active Problem List  Diagnosis  . HYPERLIPIDEMIA-MIXED  . GOUT, UNSPECIFIED  . ANEMIA  . LEUKOCYTOSIS  . OBSTRUCTIVE SLEEP APNEA  . HYPERTENSION, PULMONARY  . Aortic stenosis  . ATRIAL FIBRILLATION  . SICK SINUS/ TACHY-BRADY SYNDROME  . DIASTOLIC HEART FAILURE, CHRONIC  . DIZZINESS  . Shortness of breath  . PACEMAKER, PERMANENT  . Orthostatic hypotension  . GI bleeding  . Valvular heart disease  . Tachy-brady syndrome  . Pulmonary hypertension  . Chronic renal failure

## 2012-03-29 NOTE — Assessment & Plan Note (Signed)
We'll schedule a return function tests in the future once patient is stable.

## 2012-03-29 NOTE — Assessment & Plan Note (Signed)
Chronic heart rate stable.  EKG shows rate-controlled rhythm.

## 2012-03-29 NOTE — Assessment & Plan Note (Signed)
Moderate mitral regurgitation and significant aortic stenosis.  All contributing to the patient's tenuous hemodynamic status.

## 2012-03-29 NOTE — Progress Notes (Signed)
Alexander Bottoms, MD, [facility] Board Certified in Adult Cardiovascular Medicine,Internal Medicine and Critical Care Medicine    CC: dizziness and orthostasis.  HPI:  The patient is a 78 year old male with a complex medical history, including significant valvular heart disease and severe aortic stenosis, diastolic heart failure and permanent atrial fibrillation and oxygen dependent already hypertension. He was brought in by his wife because he has had several episodes of dizziness and weakness.  On one episode he almost passed out but felt cold and clammy.  Blood pressure was taken and was very low.  He is complaining of increased generalized weakness.  He is orthostatic in the office today.  He denies any chest pain orthopnea PND.  PMH: reviewed and listed in Problem List in Electronic Records (and see below) Past Medical History  Diagnosis Date  . Aortic stenosis     Severe aortic stenosis mean pressure gradient 33 mm of mercury  . Chronic diastolic heart failure     Multiple hospital admissions for  . Mixed hyperlipidemia   . Dementia   . Diabetes mellitus     Insulin-dependent  . Pulmonary hypertension     Severe, oxygen dependent#65-70 mmHg most recently by echocardiogram December 2012.  . Sick sinus syndrome   . Tachy-brady syndrome     s/p Guidant dual-chamber pacemaker  . Atrial fibrillation     Permanent; off Coumadin secondary to GI bleed  . History of echocardiogram     Preserved LV function  . Valvular heart disease     moderate mitral regurgitation  . Chronic anemia     Followed by Dr. Charm Barges, hemoglobin 9.9 (7/10) white count 15.2  . Dyslipidemia   . OSA on CPAP   . History of hospitalization due to heart failure 12/11    For acute and chronic diastoic heart failure and right-sided heart failure symptoms/volume overload  . Orthostatic hypotension   . GI bleeding     Coumadin discontinued  . Pacemaker    Past Surgical History  Procedure Date  . Pcm 03/26/07      Guidant INSIGNIA-1 - Tachybrady syndrome & A Fib    Allergies/SH/FHX : available in Electronic Records for review  Allergies  Allergen Reactions  . Allopurinol   . Penicillins    History   Social History  . Marital Status: Married    Spouse Name: N/A    Number of Children: N/A  . Years of Education: N/A   Occupational History  . Retired    Social History Main Topics  . Smoking status: Former Smoker -- 2.0 packs/day for 30 years    Types: Cigarettes    Quit date: 10/08/1970  . Smokeless tobacco: Never Used   Comment: Tobacco use-no  . Alcohol Use: No  . Drug Use: No  . Sexually Active: Not on file   Other Topics Concern  . Not on file   Social History Narrative  . No narrative on file   Family History  Problem Relation Age of Onset  . Coronary artery disease Neg Hx   . Diabetes Neg Hx   . Hypertension Neg Hx     Medications: Current Outpatient Prescriptions  Medication Sig Dispense Refill  . albuterol (PROVENTIL) (2.5 MG/3ML) 0.083% nebulizer solution Take 2.5 mg by nebulization as needed.        Marland Kitchen aspirin 81 MG EC tablet Take 81 mg by mouth daily.        . Calcium Carbonate-Vit D-Min 600-400 MG-UNIT TABS Take 1  tablet by mouth daily.        . carbidopa-levodopa (SINEMET) 10-100 MG per tablet Take 1 tablet by mouth 2 (two) times daily.        . carvedilol (COREG) 6.25 MG tablet Take 6.25 mg by mouth 2 (two) times daily.        . colchicine 0.6 MG tablet Take 0.6 mg by mouth daily.        Marland Kitchen donepezil (ARICEPT) 10 MG tablet Take 10 mg by mouth daily.        Marland Kitchen FLUoxetine (PROZAC) 40 MG capsule Take 40 mg by mouth daily.      Marland Kitchen gabapentin (NEURONTIN) 300 MG capsule Take 300 mg by mouth at bedtime.        Marland Kitchen glipiZIDE (GLUCOTROL) 5 MG tablet Take 5 mg by mouth 2 (two) times daily.        . insulin glargine (LANTUS SOLOSTAR) 100 UNIT/ML injection Inject into the skin as directed.        Marland Kitchen LORazepam (ATIVAN) 0.5 MG tablet Take 0.5 mg by mouth 2 (two) times daily as  needed.        . memantine (NAMENDA) 10 MG tablet Take 10 mg by mouth 2 (two) times daily.        . metolazone (ZAROXOLYN) 2.5 MG tablet Take 2.5 mg by mouth daily.      . ondansetron (ZOFRAN) 4 MG tablet Take 4 mg by mouth daily as needed. For nausea.       . OXYGEN-HELIUM IN Inhale 2-4 L into the lungs as directed.        . potassium chloride SA (K-DUR,KLOR-CON) 20 MEQ tablet Take 20 mEq by mouth 2 (two) times daily.       . simvastatin (ZOCOR) 40 MG tablet Take 20 mg by mouth at bedtime.        . torsemide (DEMADEX) 20 MG tablet Take 20 mg by mouth daily. Alternate with 40mg  qod      . verapamil (CALAN-SR) 120 MG CR tablet Take 1 tablet (120 mg total) by mouth at bedtime.  30 tablet  6    ROS: No nausea or vomiting. No fever or chills.No melena or hematochezia.No bleeding.No claudication  Physical Exam: BP 97/62  Pulse 78  Ht 6\' 1"  (1.854 m)  Wt 213 lb (96.616 kg)  BMI 28.10 kg/m2  SpO2 94% General:well-nourished white male somewhat lethargic sitting in the wheelchair Neck:diminished carotid upstroke and no carotid bruit.  No thyromegaly.  JVD and no measurable in 90 angle Lungs:diminished breath sounds bilaterally  No wheezing Cardiac irregular rate and rhythm, normal S1 and S2.  3/6 crescendo decrescendo murmur right upper sternal borderand 2/6 holosystolic murmur at the apex. Vascular:no edema. Skin:warm and dry Physcologic:normal affect  12lead AVW:[id number] fibrillation intraventricular conduction delay, left bundle morphology Limited bedside ECHO:N/A No images are attached to the encounter.   Assessment and Plan  Orthostatic hypotension Patient almost passed out.  Blood pressure was running low.  The patient appears to be over diuresed.  We will hold his diuretic including torsemide and Zaroxolyn for 5 days as well as potassium and then resume.  Aortic stenosis The patient has significant aortic stenosis which is contributing to his orthostatic  hypotension.  DIZZINESS Secondary to orthostasis.  ATRIAL FIBRILLATION Chronic heart rate stable.  EKG shows rate-controlled rhythm.  Shortness of breath I will also schedule pulmonary function test once the patient is stable.  Valvular heart disease Moderate mitral regurgitation and significant aortic stenosis.  All contributing to the patient's tenuous hemodynamic status.   Patient Active Problem List  Diagnosis  . HYPERLIPIDEMIA-MIXED  . GOUT, UNSPECIFIED  . ANEMIA  . LEUKOCYTOSIS  . OBSTRUCTIVE SLEEP APNEA  . HYPERTENSION, PULMONARY  . Aortic stenosis  . ATRIAL FIBRILLATION  . SICK SINUS/ TACHY-BRADY SYNDROME  . DIASTOLIC HEART FAILURE, CHRONIC  . DIZZINESS  . Shortness of breath  . PACEMAKER, PERMANENT  . Orthostatic hypotension  . GI bleeding  . Valvular heart disease  . Tachy-brady syndrome  . Pulmonary hypertension  . Chronic renal failure

## 2012-03-29 NOTE — Assessment & Plan Note (Signed)
The patient has significant aortic stenosis which is contributing to his orthostatic hypotension.

## 2012-03-29 NOTE — Assessment & Plan Note (Signed)
Patient almost passed out.  Blood pressure was running low.  The patient appears to be over diuresed.  We will hold his diuretic including torsemide and Zaroxolyn for 5 days as well as potassium and then resume.

## 2012-03-29 NOTE — Assessment & Plan Note (Signed)
Secondary to orthostasis.

## 2012-03-31 ENCOUNTER — Telehealth: Payer: Self-pay | Admitting: *Deleted

## 2012-03-31 NOTE — Telephone Encounter (Signed)
Message copied by Lesle Chris on Tue Mar 31, 2012  2:46 PM ------      Message from: Learta Codding      Created: Sat Mar 28, 2012  1:04 PM       Reviewed moderate obstructive disease NTD

## 2012-03-31 NOTE — Telephone Encounter (Signed)
Notes Recorded by Lesle Chris, LPN on 10/25/1476 at 2:46 PM Busy.

## 2012-04-03 NOTE — Telephone Encounter (Signed)
Wife (Opal) notified of below.

## 2012-04-07 ENCOUNTER — Other Ambulatory Visit: Payer: Self-pay | Admitting: *Deleted

## 2012-04-13 ENCOUNTER — Telehealth: Payer: Self-pay | Admitting: *Deleted

## 2012-04-13 NOTE — Telephone Encounter (Signed)
Message copied by Lesle Chris on Mon Apr 13, 2012  4:43 PM ------      Message from: [name]      Created: Mon Apr 13, 2012  3:37 PM       Stable CKD with creatinine 1.7. Continue current medication regimen.

## 2012-04-13 NOTE — Telephone Encounter (Signed)
Notes Recorded by Lesle Chris, LPN on 0/01/5408 at 4:43 PM Wife (Opal) notified of below.

## 2012-04-15 DIAGNOSIS — I495 Sick sinus syndrome: Secondary | ICD-10-CM

## 2012-04-28 ENCOUNTER — Ambulatory Visit: Payer: Medicare Other | Admitting: Cardiology

## 2012-05-01 ENCOUNTER — Ambulatory Visit (HOSPITAL_BASED_OUTPATIENT_CLINIC_OR_DEPARTMENT_OTHER)
Admission: RE | Admit: 2012-05-01 | Discharge: 2012-05-01 | Disposition: A | Discharge status: Home / Self Care | Payer: Medicare Other | Source: Ambulatory Visit | Attending: Cardiovascular Disease | Admitting: Cardiovascular Disease

## 2012-05-01 ENCOUNTER — Encounter (HOSPITAL_COMMUNITY): Payer: Self-pay | Admitting: Cardiovascular Disease

## 2012-05-01 ENCOUNTER — Encounter (HOSPITAL_COMMUNITY): Payer: Self-pay | Admitting: Thoracic Surgery (Cardiothoracic Vascular Surgery)

## 2012-05-01 ENCOUNTER — Ambulatory Visit (HOSPITAL_COMMUNITY)
Admission: RE | Admit: 2012-05-01 | Discharge: 2012-05-01 | Disposition: A | Discharge status: Home / Self Care | Payer: Medicare Other | Source: Ambulatory Visit | Attending: Thoracic Surgery (Cardiothoracic Vascular Surgery) | Admitting: Thoracic Surgery (Cardiothoracic Vascular Surgery)

## 2012-05-01 VITALS — BP 121/72 | HR 77 | Resp 20 | Ht 73.0 in | Wt 209.0 lb

## 2012-05-01 VITALS — BP 121/72 | HR 77 | Resp 21 | Ht 73.0 in | Wt 209.0 lb

## 2012-05-01 DIAGNOSIS — I350 Nonrheumatic aortic (valve) stenosis: Secondary | ICD-10-CM

## 2012-05-01 DIAGNOSIS — I359 Nonrheumatic aortic valve disorder, unspecified: Secondary | ICD-10-CM | POA: Insufficient documentation

## 2012-05-01 HISTORY — DX: Syncope and collapse: R55

## 2012-05-01 HISTORY — DX: Heart failure, unspecified: I50.9

## 2012-05-01 HISTORY — DX: Essential (primary) hypertension: I10

## 2012-05-01 NOTE — Progress Notes (Signed)
[address]            [name] [address]          [phone]     CARDIOTHORACIC SURGERY CONSULTATION REPORT  Referring Provider is de Melanie Crazier, MD PCP is Samuel Jester, DO  Chief Complaint  Patient presents with  . 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  He has been hospitalized several times for exacerbations of chronic diastolic heart failure and renal insufficiency, and he has had chronic problems with dizziness and orthostatic hypotension. He has had gradual progression decline in his respiratory status. He has remained oxygen dependent for several years, and recently has had to increase to 4 L per minute continuous oxygen therapy.  Recent echocardiogram performed at [facility] in [place] documented progression of the severity of aortic stenosis with peak velocity across the valve 3.85 m/sec corresponding to peak and mean transvalvular gradients of 59 and 33 mm Hg respectively.  Aortic valve area was estimated to be 0.92-0.99 cm2.  There remained normal LV systolic function with ejection fraction estimated 60-65%.  The patient has been referred to the multidisciplinary structural heart valve clinic to consider possible treatment options for severe symptomatic aortic stenosis.  Past Medical History  Diagnosis Date  . Aortic stenosis     Severe aortic stenosis mean pressure gradient 33 mm of mercury  . Chronic diastolic heart failure     Multiple hospital admissions for  . Mixed hyperlipidemia   . Dementia   . Diabetes  mellitus     Insulin-dependent  . Pulmonary hypertension     Severe, oxygen dependent#65-70 mmHg most recently by echocardiogram December 2012.  . Sick sinus syndrome   . Tachy-brady syndrome     s/p Guidant dual-chamber pacemaker  . Atrial fibrillation     Permanent; off Coumadin secondary to GI bleed  . History of echocardiogram     Preserved LV function  . Valvular heart disease     moderate mitral regurgitation  . Chronic anemia     Followed by Dr. Charm Barges, hemoglobin 9.9 (7/10) white count 15.2  . Dyslipidemia   . OSA on CPAP   . History of hospitalization due to heart failure 12/11    For acute and chronic diastoic heart failure and right-sided heart failure symptoms/volume overload  . Orthostatic hypotension   . GI bleeding     Coumadin discontinued  . Pacemaker   . Hypertension   . Syncope and collapse   . CHF (congestive heart failure)     Past Surgical History  Procedure Date  . Pcm 03/26/07    Guidant INSIGNIA-1 - Tachybrady syndrome & A Fib    Family History  Problem Relation Age of Onset  . Coronary artery disease Neg Hx   . Diabetes Neg Hx   . Hypertension Neg Hx     Social History History  Substance Use Topics  . Smoking status: Former Smoker -- 2.0 packs/day for 30 years    Types: Cigarettes    Quit date: 10/08/1970  .  Smokeless tobacco: Never Used   Comment: Tobacco use-no  . Alcohol Use: No    Current Outpatient Prescriptions  Medication Sig Dispense Refill  . albuterol (PROVENTIL) (2.5 MG/3ML) 0.083% nebulizer solution Take 2.5 mg by nebulization as needed.        Marland Kitchen aspirin 81 MG EC tablet Take 81 mg by mouth daily.        . Calcium Carbonate-Vit D-Min 600-400 MG-UNIT TABS Take 1 tablet by mouth daily.        . carbidopa-levodopa (SINEMET) 10-100 MG per tablet Take 1 tablet by mouth 2 (two) times daily.        . carvedilol (COREG) 6.25 MG tablet Take 6.25 mg by mouth 2 (two) times daily.        . colchicine 0.6 MG tablet Take 0.6 mg by mouth  daily.        Marland Kitchen donepezil (ARICEPT) 10 MG tablet Take 10 mg by mouth daily.        Marland Kitchen FLUoxetine (PROZAC) 40 MG capsule Take 40 mg by mouth daily.      Marland Kitchen gabapentin (NEURONTIN) 300 MG capsule Take 300 mg by mouth at bedtime.        Marland Kitchen glipiZIDE (GLUCOTROL) 5 MG tablet Take 5 mg by mouth 2 (two) times daily.        . insulin glargine (LANTUS SOLOSTAR) 100 UNIT/ML injection Inject into the skin as directed.        Marland Kitchen LORazepam (ATIVAN) 0.5 MG tablet Take 0.5 mg by mouth 2 (two) times daily as needed.        . memantine (NAMENDA) 10 MG tablet Take 10 mg by mouth 2 (two) times daily.        . metolazone (ZAROXOLYN) 2.5 MG tablet Take 2.5 mg by mouth daily.      . ondansetron (ZOFRAN) 4 MG tablet Take 4 mg by mouth daily as needed. For nausea.       . OXYGEN-HELIUM IN Inhale 2-4 L into the lungs as directed.        . potassium chloride SA (K-DUR,KLOR-CON) 20 MEQ tablet Take 20 mEq by mouth 2 (two) times daily.       . simvastatin (ZOCOR) 40 MG tablet Take 20 mg by mouth at bedtime.        . torsemide (DEMADEX) 20 MG tablet Take 20 mg by mouth daily. Alternate with 40mg  qod      . verapamil (CALAN-SR) 120 MG CR tablet Take 1 tablet (120 mg total) by mouth at bedtime.  30 tablet  6    Allergies  Allergen Reactions  . Allopurinol   . Penicillins      Review of Systems:   General:  stable appetite, decreased energy, no weight gain/loss  HEENT:  no blurry vision, no recent vision changes, no epistaxis, no hearing loss, no loose teeth or painful teeth, upper plate dentures with a few remaining teeth on lower jaw, last saw dentist several years ago,   Respiratory:  + shortness of breath, no cough, + wheezing, no hemoptysis, no pain with inspiration or cough  Cardiac:  no chest pain with/without exertion, SOB with mild exertion, occasional resting SOB, no PND, no orthopnea, no LE edema, no palpitations, no arrhythmia, + dizzy spells, no syncope  Vascular:  no pain suggestive of claudication  GI:   no  difficulty swallowing, no heartburn/reflux, no hematochezia, no hematemesis, no melena, no constipation, no diarrhea   GU:   no dysuria, no urgency, some frequency  Musculoskeletal: mile arthritis, no myalgias, mobility limited, some difficulty walking - uses a walker  Neuro:   no symptoms suggestive of TIA's, no seizures, no headaches, no peripheral neuropathy, some instability of gait, denies memory/cognitive dysfunction  Endocrine:  + diabetes, no thyroid dysfunction  Hematologic:  + easy bruising, + anemia, no abnormal bleeding  Psych:   no anxiety, no depression     Physical Exam:   BP 121/72  Pulse 77  Resp 20  Ht 6\' 1"  (1.854 m)  Wt 209 lb (94.802 kg)  BMI 27.57 kg/m2  SpO2 94%  General:  Elderly and frail-appearing  HEENT:  Unremarkable   Neck:   no JVD, no bruits, no adenopathy   Chest:   clear to auscultation, symmetrical breath sounds, no wheezes, no rhonchi   CV:   RRR, grade III/VI crescendo/decrescendo murmur heard best at RUSB,  no diastolic murmur  Abdomen:  soft, non-tender, no masses   Extremities:  warm, well-perfused, pulses not palpable, no LE edema  Rectal/GU  Deferred  Neuro:   Grossly non-focal and symmetrical throughout  Skin:   Clean and dry, no rashes, no breakdown   Diagnostic Tests:  Transthoracic Echocardiogram  Transthoracic echocardiogram performed 03/18/2012 at [facility] is directly reviewed. There is normal left ventricular chamber size with severe concentric left ventricular hypertrophy. Systolic function appears normal with ejection fraction estimated 60-65%. There is severe aortic stenosis with peak velocity across the aortic valve measured 3.85 m/s corresponding to peak and mean transvalvular gradients of 59 and 33 mm mercury respectively. The aortic valve appears tricuspid with severe leaflet restriction and heavy calcification. The left ventricular output tract diameter was measured 21 mm. There was mild to moderate mitral  regurgitation. There is moderate left atrial enlargement. There is a pacemaker wire in the right ventricle with moderate tricuspid regurgitation. There is evidence suggestive of severe pulmonary hypertension although right ventricular function is only mildly reduced and the right ventricle was only mildly dilated.   Pulmonary Function Tests (performed 03/27/2012 at [facility]) Baseline  FVC  2.35 L  (55% predicted) FEV1  1.58 L  (48% predicted) FEF25-75 1.01 L  (33% predicted)  Post-Brochodilator  FVC  2.49 L  (58% predicted) FEV1  1.71 L  (52% predicted) FEF25-75 1.01 L  (33% predicted)  ABG on O2 therapy with pH 7.42 PCO2 56 PO2 61    6 Minute Walk Test Results  Patient: [name] Date:  05/01/2012   Supplemental O2 during test? On 4L of O2 the entire walk, no extra oxygen needed.      Baseline   End  Time   13:47    13:53 Heartrate  77    91 Dyspnea  mild    moderate Fatigue  moderate   moderate O2 sat   94%    98% Blood pressure 121/72    125/65   Patient ambulated at a slow pace for a total distance of 425 feet with 4 stops.  Ambulation was limited primarily due to being very short of breath and weak, used a walker to walk.  Overall the test was tolerated well.     STS Risk Calculator  Procedure    AVR   Risk of Mortality   9.414% Morbidity or Mortality  39.606% Prolonged LOS   24.494% Short LOS    8.744% Permanent Stroke   1.974% Prolonged Vent Support  28.346% DSW Infection    0.659% Renal Failure    17.901% Reoperation  12.076%      Impression:  The patient has severe symptomatic aortic stenosis in the setting of underlying severe chronic lung disease with pulmonary hypertension related to COPD and long-standing work-related exposure to dusts in the [place] where he worked.  He has numerous other comorbid medical problems including permanent atrial fibrillation, tachybradycardia syndrome with previous permanent pacemaker,  and somewhat limited physical mobility with significant deconditioning.  He appears quite frail.  I do not feel that he would do well with conventional surgery for aortic valve replacement with or without concomitant coronary artery bypass grafting.  Options include continued palliative medical therapy versus consideration of transcatheter aortic valve replacement if aggressive treatment is desired.    Plan:  The patient was seen in conjunction with Dr. Excell Seltzer at multidisciplinary structural heart valve clinic.  We spent in excess of 60 minutes reviewing the patient's recent echocardiogram and discussing matters at length with the patient and his family. At this time the patient wants to think matters over further before making any decision as to whether or not to proceed with further diagnostic tests to see if transcatheter aortic valve replacement might be feasible. If they decide to proceed with a somewhat aggressive approach, the patient would need left and right heart catheterization to rule out the presence of coronary artery disease, directly measure the severity of pulmonary hypertension and right heart dysfunction, and reevaluate the severity of aortic stenosis. We will followup with the patient within the next week or 2 and make plans for catheterization if desired. All of their questions been addressed.    Salvatore Decent. Cornelius Moras, MD 05/01/2012 7:41 PM

## 2012-05-01 NOTE — Progress Notes (Addendum)
Pulmonary Function Tests Baseline  FVC  2.35 L  (55% predicted) FEV1  1.58 L  (48% predicted) FEF25-75 1.01 L  (33% predicted)  Post-Brochodilator  Pulmonary Function Tests  FVC  2.49 L  (58% predicted) FEV1  1.71 L  (52% predicted) FEF25-75 1.01 L  (33% predicted)    6 Minute Walk Test Results  Patient: [name] Date:  05/01/2012   Supplemental O2 during test? On 4L of O2 the entire walk, no extra oxygen needed.      Baseline   End  Time   13:47    13:53 Heartrate  77    91 Dyspnea  mild    moderate Fatigue  moderate   moderate O2 sat   94%    98% Blood pressure 121/72    125/65   Patient ambulated at a slow pace for a total distance of 425 feet with 4 stops.  Ambulation was limited primarily due to being very short of breath and weak, used a walker to walk.  Overall the test was tolerated well.     STS Risk Calculator  Procedure    AVR   Risk of Mortality   9.414% Morbidity or Mortality  39.606% Prolonged LOS   24.494% Short LOS    8.744% Permanent Stroke   1.974% Prolonged Vent Support  28.346% DSW Infection    0.659% Renal Failure    17.901% Reoperation    12.076%

## 2012-05-01 NOTE — Consult Note (Signed)
MULTIDISCIPLINARY HEART VALVE CLINIC NOTE  Patient ID: [name] MRN: [id number] DOB/AGE: [birth year] 78 y.o.  Primary Care Physician:BUTLER, CYNTHIA, DO Primary Cardiologist: DR Andee Lineman  HPI: 78 year-old gentleman referred for evaluation of severe aortic stenosis. The patient has a multitude of medical problems with O2 dependence currently at 4L. He has COPD and pulmonary hypertension. He also has chronic atrial fibrillation and is no longer on anticoagulation because of GI bleeding.   The patient has become increasingly short of breath with low-level activity and he was  hospitalized with heart failure earlier this year. His volume management has become difficult. He has very poor energy level and ambulates only with a walker. He denies substernal chest pain or pressure. He denies leg swelling, orthopnea, or PND. However, he did have increased edema when cutting back on diuretics a few months ago. He also complains of dizziness, especially when he first stands up. He has not had syncope.  He lives independently with his wife. He no longer drives a car and he is sedentary. He is a remote smoker.  Past Medical History  Diagnosis Date  . Aortic stenosis     Severe aortic stenosis mean pressure gradient 33 mm of mercury  . Chronic diastolic heart failure     Multiple hospital admissions for  . Mixed hyperlipidemia   . Dementia   . Diabetes mellitus     Insulin-dependent  . Pulmonary hypertension     Severe, oxygen dependent#65-70 mmHg most recently by echocardiogram December 2012.  . Sick sinus syndrome   . Tachy-brady syndrome     s/p Guidant dual-chamber pacemaker  . Atrial fibrillation     Permanent; off Coumadin secondary to GI bleed  . History of echocardiogram     Preserved LV function  . Valvular heart disease     moderate mitral regurgitation  . Chronic anemia     Followed by Dr. Charm Barges, hemoglobin 9.9 (7/10) white count 15.2  . Dyslipidemia   . OSA on CPAP   . History  of hospitalization due to heart failure 12/11    For acute and chronic diastoic heart failure and right-sided heart failure symptoms/volume overload  . Orthostatic hypotension   . GI bleeding     Coumadin discontinued  . Pacemaker   . Hypertension   . Syncope and collapse   . CHF (congestive heart failure)     Past Surgical History  Procedure Date  . Pcm 03/26/07    Guidant INSIGNIA-1 - Tachybrady syndrome & A Fib    Family History  Problem Relation Age of Onset  . Coronary artery disease Neg Hx   . Diabetes Neg Hx   . Hypertension Neg Hx     History   Social History  . Marital Status: Married    Spouse Name: N/A    Number of Children: N/A  . Years of Education: N/A   Occupational History  . Retired    Social History Main Topics  . Smoking status: Former Smoker -- 2.0 packs/day for 30 years    Types: Cigarettes    Quit date: 10/08/1970  . Smokeless tobacco: Never Used   Comment: Tobacco use-no  . Alcohol Use: No  . Drug Use: No  . Sexually Active: Not on file   Other Topics Concern  . Not on file   Social History Narrative  . No narrative on file    Current Outpatient Prescriptions   Medication  Sig  Dispense  Refill   .  albuterol (  PROVENTIL) (2.5 MG/3ML) 0.083% nebulizer solution  Take 2.5 mg by nebulization as needed.     Marland Kitchen  aspirin 81 MG EC tablet  Take 81 mg by mouth daily.     .  Calcium Carbonate-Vit D-Min 600-400 MG-UNIT TABS  Take 1 tablet by mouth daily.     .  carbidopa-levodopa (SINEMET) 10-100 MG per tablet  Take 1 tablet by mouth 2 (two) times daily.     .  carvedilol (COREG) 6.25 MG tablet  Take 6.25 mg by mouth 2 (two) times daily.     .  colchicine 0.6 MG tablet  Take 0.6 mg by mouth daily.     Marland Kitchen  donepezil (ARICEPT) 10 MG tablet  Take 10 mg by mouth daily.     Marland Kitchen  FLUoxetine (PROZAC) 40 MG capsule  Take 40 mg by mouth daily.     Marland Kitchen  gabapentin (NEURONTIN) 300 MG capsule  Take 300 mg by mouth at bedtime.     Marland Kitchen  glipiZIDE (GLUCOTROL) 5 MG tablet   Take 5 mg by mouth 2 (two) times daily.     .  insulin glargine (LANTUS SOLOSTAR) 100 UNIT/ML injection  Inject into the skin as directed.     Marland Kitchen  LORazepam (ATIVAN) 0.5 MG tablet  Take 0.5 mg by mouth 2 (two) times daily as needed.     .  memantine (NAMENDA) 10 MG tablet  Take 10 mg by mouth 2 (two) times daily.     .  metolazone (ZAROXOLYN) 2.5 MG tablet  Take 2.5 mg by mouth daily.     .  ondansetron (ZOFRAN) 4 MG tablet  Take 4 mg by mouth daily as needed. For nausea.     .  OXYGEN-HELIUM IN  Inhale 2-4 L into the lungs as directed.     .  potassium chloride SA (K-DUR,KLOR-CON) 20 MEQ tablet  Take 20 mEq by mouth 2 (two) times daily.     .  simvastatin (ZOCOR) 40 MG tablet  Take 20 mg by mouth at bedtime.     .  torsemide (DEMADEX) 20 MG tablet  Take 20 mg by mouth daily. Alternate with 40mg  qod     .  verapamil (CALAN-SR) 120 MG CR tablet  Take 1 tablet (120 mg total) by mouth at bedtime.  30 tablet  6       Allergies  Allergen Reactions  . Allopurinol   . Penicillins     ROS:  General: no fevers/chills/night sweats, poor energy Eyes: no blurry vision, diplopia, or amaurosis ENT: no sore throat or hearing loss Resp: no cough or hemoptysis CV: no edema or palpitations GI: no abdominal pain, nausea, vomiting, diarrhea, or constipation GU: no dysuria, frequency, or hematuria Skin: no rash Neuro: no headache, numbness, tingling, or weakness of extremities Musculoskeletal: positive for hip and low back pain, leg weakness Heme: no bleeding, DVT, positive for easy bruising Endo: no polydipsia or polyuria  BP 121/72  Pulse 77  Resp 21  Ht 6\' 1"  (1.854 m)  Wt 94.802 kg (209 lb)  BMI 27.57 kg/m2  SpO2 94%  PHYSICAL EXAM: Pt is alert and oriented, elderly, frail man in no distress. HEENT: normal Neck: JVP normal. Carotid upstrokes normal with bilateral bruits. No thyromegaly. Lungs: equal expansion, clear bilaterally CV: Apex is discrete and nondisplaced, RRR with grade 3/6  systolic crescendo decresendo murmur heard a tthe LSB and RUSB, no diastolic murmur or gallop Abd: soft, NT, +BS, no bruit, no hepatosplenomegaly Back: no  CVA tenderness Ext: no C/C/E        DP/PT pulses intact and = Skin: warm and dry without rash Neuro: CNII-XII intact             Strength intact = bilaterally  EKG:  V paced 60 bpm  2D ECHO: 03/18/2012 - personally reviewed. LVEF 65% with normal wall motion, severe AS with peak and mean gradients of 59 and , respectively. AVA 0.99 square cm. Moderate MR and TR, Mild AR, and severe pulmonary HTN.  Pulmonary Function Tests  Baseline  FVC 2.35 L (55% predicted)  FEV1 1.58 L (48% predicted)  FEF25-75 1.01 L (33% predicted)  Post-Brochodilator  Pulmonary Function Tests  FVC 2.49 L (58% predicted)  FEV1 1.71 L (52% predicted)  FEF25-75 1.01 L (33% predicted)   6 Minute Walk Test Results  Patient: [name]  Date: 05/01/2012  Supplemental O2 during test? On 4L of O2 the entire walk, no extra oxygen needed.  Baseline End  Time 13:47 13:53  Heartrate 77 91  Dyspnea mild moderate  Fatigue moderate moderate  O2 sat 94% 98%  Blood pressure 121/72 125/65  Patient ambulated at a slow pace for a total distance of 425 feet with 4 stops.  Ambulation was limited primarily due to being very short of breath and weak, used a walker to walk.  Overall the test was tolerated well.   STS Risk Calculator  Procedure AVR  Risk of Mortality 9.414%  Morbidity or Mortality 39.606%  Prolonged LOS 24.494%  Short LOS 8.744%  Permanent Stroke 1.974%  Prolonged Vent Support 28.346%  DSW Infection 0.659%  Renal Failure 17.901%  Reoperation 12.076%  ASSESSMENT AND PLAN:  78 year-old man with severe but not critical aortic stenosis. He is symptomatic with NYHA class III symptoms. He has multiple comorbidities as outlined above, most notably severe COPD and pulmonary HTN. He also has a clear component of 'frailty,' which is not accounted for in  traditional risk calculators. I agree with Dr Cornelius Moras that he is not a candidate for conventional open aortic valve replacement. The question is whether he may be a candidate for transcatheter aortic valve replacement.   The patient was seen concurrently with Dr Cornelius Moras, and we reviewed the pathophysiology, prognosis, and treatment options associated with severe aortic stenosis in detail with the patient and his wife. If they decide to pursue therapy, the next step would be to undergo right and left heart catheterization. This would allow for assessment of intracardiac hemodynamics and coronary anatomy. We reviewed the risks, indications, and alternatives to diagnostic right and left heart cath. They understand and would like to think about whether to pursue further therapy. We will follow-up with their decision within the next few weeks.  Depending on the findings at cardiac cath, I think the patient could be considered for balloon valvuloplasty or TAVR and these options will be better defined once his cath is done.  [name] 05/01/2012 11:44 PM

## 2012-05-08 ENCOUNTER — Telehealth: Payer: Self-pay | Admitting: Cardiovascular Disease

## 2012-05-08 DIAGNOSIS — I350 Nonrheumatic aortic (valve) stenosis: Secondary | ICD-10-CM

## 2012-05-08 NOTE — Telephone Encounter (Signed)
Spoke with the patient's wife and let her know that he would be calling next week to set up his heart catheterization. I am not on vacation next week and we will schedule his heart cath for the following week.

## 2012-05-11 NOTE — Telephone Encounter (Signed)
I spoke with the pt's wife and made her aware that cardiac cath is scheduled on 05/20/12 at 8:30, arrive at 7:30.  The pt will come into the office on Wednesday 05/13/12 for pre-procedure lab work.  I will review pre-cath instructions during lab appointment.

## 2012-05-13 ENCOUNTER — Telehealth: Payer: Self-pay | Admitting: Cardiovascular Disease

## 2012-05-13 ENCOUNTER — Ambulatory Visit (INDEPENDENT_AMBULATORY_CARE_PROVIDER_SITE_OTHER): Payer: Medicare Other | Admitting: *Deleted

## 2012-05-13 DIAGNOSIS — I359 Nonrheumatic aortic valve disorder, unspecified: Secondary | ICD-10-CM

## 2012-05-13 DIAGNOSIS — E785 Hyperlipidemia, unspecified: Secondary | ICD-10-CM

## 2012-05-13 DIAGNOSIS — I350 Nonrheumatic aortic (valve) stenosis: Secondary | ICD-10-CM

## 2012-05-13 DIAGNOSIS — I2789 Other specified pulmonary heart diseases: Secondary | ICD-10-CM

## 2012-05-13 DIAGNOSIS — I4891 Unspecified atrial fibrillation: Secondary | ICD-10-CM

## 2012-05-13 LAB — CBC WITH DIFFERENTIAL/PLATELET
Basophils Relative: 0.2 % (ref 0.0–3.0)
Hemoglobin: 13.9 g/dL (ref 13.0–17.0)
Lymphocytes Relative: 7.2 % — ABNORMAL LOW (ref 12.0–46.0)
Monocytes Relative: 8.2 % (ref 3.0–12.0)
Neutro Abs: 8.7 10*3/uL — ABNORMAL HIGH (ref 1.4–7.7)
RBC: 4.46 Mil/uL (ref 4.22–5.81)
WBC: 11.1 10*3/uL — ABNORMAL HIGH (ref 4.5–10.5)

## 2012-05-13 LAB — BASIC METABOLIC PANEL
CO2: 31 mEq/L (ref 19–32)
Calcium: 9.7 mg/dL (ref 8.4–10.5)
GFR: 52.45 mL/min — ABNORMAL LOW (ref >60.00)
Sodium: 141 mEq/L (ref 135–145)

## 2012-05-15 ENCOUNTER — Other Ambulatory Visit: Payer: Self-pay | Admitting: Cardiovascular Disease

## 2012-05-19 ENCOUNTER — Encounter: Payer: Self-pay | Admitting: *Deleted

## 2012-05-20 ENCOUNTER — Inpatient Hospital Stay (HOSPITAL_BASED_OUTPATIENT_CLINIC_OR_DEPARTMENT_OTHER)
Admission: RE | Admit: 2012-05-20 | Discharge: 2012-05-20 | Disposition: A | Discharge status: Home / Self Care | Payer: Medicare Other | Source: Ambulatory Visit | Attending: Cardiovascular Disease | Admitting: Cardiovascular Disease

## 2012-05-20 ENCOUNTER — Encounter (HOSPITAL_BASED_OUTPATIENT_CLINIC_OR_DEPARTMENT_OTHER)
Admission: RE | Disposition: A | Discharge status: Home / Self Care | Payer: Self-pay | Source: Ambulatory Visit | Attending: Cardiovascular Disease

## 2012-05-20 DIAGNOSIS — G4733 Obstructive sleep apnea (adult) (pediatric): Secondary | ICD-10-CM | POA: Insufficient documentation

## 2012-05-20 DIAGNOSIS — I509 Heart failure, unspecified: Secondary | ICD-10-CM | POA: Insufficient documentation

## 2012-05-20 DIAGNOSIS — I359 Nonrheumatic aortic valve disorder, unspecified: Secondary | ICD-10-CM

## 2012-05-20 DIAGNOSIS — I2789 Other specified pulmonary heart diseases: Secondary | ICD-10-CM | POA: Insufficient documentation

## 2012-05-20 DIAGNOSIS — I5032 Chronic diastolic (congestive) heart failure: Secondary | ICD-10-CM | POA: Insufficient documentation

## 2012-05-20 DIAGNOSIS — I10 Essential (primary) hypertension: Secondary | ICD-10-CM | POA: Insufficient documentation

## 2012-05-20 DIAGNOSIS — Z794 Long term (current) use of insulin: Secondary | ICD-10-CM | POA: Insufficient documentation

## 2012-05-20 DIAGNOSIS — I2510 Atherosclerotic heart disease of native coronary artery without angina pectoris: Secondary | ICD-10-CM | POA: Insufficient documentation

## 2012-05-20 DIAGNOSIS — Z950 Presence of cardiac pacemaker: Secondary | ICD-10-CM | POA: Insufficient documentation

## 2012-05-20 DIAGNOSIS — E785 Hyperlipidemia, unspecified: Secondary | ICD-10-CM | POA: Insufficient documentation

## 2012-05-20 DIAGNOSIS — F0390 Unspecified dementia without behavioral disturbance: Secondary | ICD-10-CM | POA: Insufficient documentation

## 2012-05-20 HISTORY — PX: CARDIAC CATHETERIZATION: SHX172

## 2012-05-20 LAB — POCT I-STAT 3, ART BLOOD GAS (G3+)
Bicarbonate: 32.7 mEq/L — ABNORMAL HIGH (ref 20.0–24.0)
O2 Saturation: 98 %
TCO2: 34 mmol/L (ref 0–100)
pCO2 arterial: 54.7 mmHg — ABNORMAL HIGH (ref 35.0–45.0)
pO2, Arterial: 102 mmHg — ABNORMAL HIGH (ref 80.0–100.0)

## 2012-05-20 LAB — POCT I-STAT 3, VENOUS BLOOD GAS (G3P V)
O2 Saturation: 62 %
TCO2: 35 mmol/L (ref 0–100)
pCO2, Ven: 58.5 mmHg — ABNORMAL HIGH (ref 45.0–50.0)

## 2012-05-20 SURGERY — JV LEFT AND RIGHT HEART CATHETERIZATION WITH CORONARY ANGIOGRAM
Anesthesia: Moderate Sedation

## 2012-05-20 MED ORDER — ASPIRIN 81 MG PO CHEW
324.0000 mg | CHEWABLE_TABLET | ORAL | Status: AC
  Administered 2012-05-20: 324 mg via ORAL

## 2012-05-20 MED ORDER — DIAZEPAM 2 MG PO TABS: 2.0000 mg | ORAL_TABLET | ORAL | Status: DC

## 2012-05-20 MED ORDER — ONDANSETRON HCL 4 MG/2ML IJ SOLN: 4.0000 mg | Freq: Four times a day (QID) | INTRAMUSCULAR | Status: DC | PRN

## 2012-05-20 MED ORDER — SODIUM CHLORIDE 0.9 % IV SOLN: 1.0000 mL/kg/h | INTRAVENOUS | Status: DC

## 2012-05-20 MED ORDER — SODIUM CHLORIDE 0.9 % IJ SOLN: 3.0000 mL | INTRAMUSCULAR | Status: DC | PRN

## 2012-05-20 MED ORDER — SODIUM CHLORIDE 0.9 % IJ SOLN: 3.0000 mL | Freq: Two times a day (BID) | INTRAMUSCULAR | Status: DC

## 2012-05-20 MED ORDER — SODIUM CHLORIDE 0.9 % IV SOLN
INTRAVENOUS | Status: DC
  Administered 2012-05-20: 08:00:00 via INTRAVENOUS

## 2012-05-20 MED ORDER — SODIUM CHLORIDE 0.9 % IV SOLN: 250.0000 mL | INTRAVENOUS | Status: DC | PRN

## 2012-05-20 MED ORDER — ACETAMINOPHEN 325 MG PO TABS: 650.0000 mg | ORAL_TABLET | ORAL | Status: DC | PRN

## 2012-05-20 NOTE — Interval H&P Note (Signed)
History and Physical Interval Note:  05/20/2012 9:03 AM  [name]  has presented today for surgery, with the diagnosis of dyspnea/AS  The various methods of treatment have been discussed with the patient and family. After consideration of risks, benefits and other options for treatment, the patient has consented to  Procedure(s) (LRB): JV LEFT AND RIGHT HEART CATHETERIZATION WITH CORONARY ANGIOGRAM (N/A) as a surgical intervention .  The patient's history has been reviewed, patient examined, no change in status, stable for surgery.  I have reviewed the patient's chart and labs.  Questions were answered to the patient's satisfaction.     [name]

## 2012-05-20 NOTE — CV Procedure (Signed)
   Cardiac Catheterization Procedure Note  Name: [name] MRN: [id number] DOB: Apr 20, 1925  Procedure: Right Heart Cath, Left Heart Cath, Selective Coronary Angiography  Indication: Severe AS   Procedural Details: The right groin was prepped, draped, and anesthetized with 1% lidocaine. Using the modified Seldinger technique a 6 French sheath was placed in the right femoral artery and a 6 French sheath was placed in the right femoral vein. A multipurpose catheter was used for the right heart catheterization. Standard protocol was followed for recording of right heart pressures and sampling of oxygen saturations. Fick cardiac output was calculated. Standard Judkins catheters were used for selective coronary angiography. The aortic valve was difficult to cross. I made a fairly prolonged attempt with an AL-1 catheter and straight-tip wire and this was unsuccessful. After changing to an AL - 2 catheter I was able to cross the valve with a straight tip wire. This was changed out to a 6 [place] Langston catheter for simultaneous LV and aortic pressure recording. There were no immediate procedural complications. The patient was transferred to the post catheterization recovery area for further monitoring.  Procedural Findings: Hemodynamics RA 8 RV 50/9 PA 50/19 with a mean of 33 PCWP V wave of 23, mean of 16 LV 173/19 AO 149/72 with a mean of 105  Oxygen saturations: PA 98 AO 62  Cardiac Output (Fick) 4 Cardiac Index (Fick) 1.8  Aortic valve hemodynamics: Mean gradient 15, valve area 1.5, valve area index 0.7  Coronary angiography: Coronary dominance: right  Left mainstem: The left main is widely patent.  Left anterior descending (LAD): The LAD is patent to the left ventricular apex. There is mild proximal calcification without significant stenosis. There is minor diffuse plaque throughout the proximal LAD. There are 2 small diagonals present. The mid LAD has an area of tortuosity with no  significant associated stenosis.  Left circumflex (LCx): The circumflex is widely patent. There is a single large OM branch it fits into the distribution of the second OM. There is no significant stenosis throughout the left circumflex distribution.  Right coronary artery (RCA): The right coronary artery is patent. The vessel gives off a small to moderate-sized PDA and 2 posterolateral branches. The second posterolateral branch is fairly large and covers a large distribution. There are no significant stenoses throughout the course of the right coronary artery.  Left ventriculography: Deferred because of renal insufficiency.  The aortic valve is noted to be severely calcified with visual he restricted opening biplane fluoroscopy  Final Conclusions:   1. Minor nonobstructive coronary artery disease 2. Mild pulmonary hypertension 3. Moderate aortic stenosis  Recommendations: While the aortic valve is difficult to cross and is severely calcified, the hemodynamics suggest moderate aortic stenosis. When considering his echo results, I suspect he is at the border of moderate to severe aortic stenosis. Will plan on followup in the multidisciplinary valve clinic. We could consider seeing him back for discussion in the near future worse his waiting about 6 months for repeat echocardiogram and followup visit at that time. I will review with Dr. Cornelius Moras to see which approach he favors.   [name] 05/20/2012, 9:46 AM

## 2012-05-20 NOTE — H&P (View-Only) (Signed)
MULTIDISCIPLINARY HEART VALVE CLINIC NOTE  Patient ID: [name] MRN: [id number] DOB/AGE: 76/29/1926 76 y.o.  Primary Care Physician:BUTLER, CYNTHIA, DO Primary Cardiologist: DR DEGENT  HPI: 78 year-old gentleman referred for evaluation of severe aortic stenosis. The patient has a multitude of medical problems with O2 dependence currently at 4L. He has COPD and pulmonary hypertension. He also has chronic atrial fibrillation and is no longer on anticoagulation because of GI bleeding.   The patient has become increasingly short of breath with low-level activity and he was  hospitalized with heart failure earlier this year. His volume management has become difficult. He has very poor energy level and ambulates only with a walker. He denies substernal chest pain or pressure. He denies leg swelling, orthopnea, or PND. However, he did have increased edema when cutting back on diuretics a few months ago. He also complains of dizziness, especially when he first stands up. He has not had syncope.  He lives independently with his wife. He no longer drives a car and he is sedentary. He is a remote smoker.  Past Medical History  Diagnosis Date  . Aortic stenosis     Severe aortic stenosis mean pressure gradient 33 mm of mercury  . Chronic diastolic heart failure     Multiple hospital admissions for  . Mixed hyperlipidemia   . Dementia   . Diabetes mellitus     Insulin-dependent  . Pulmonary hypertension     Severe, oxygen dependent#65-70 mmHg most recently by echocardiogram December 2012.  . Sick sinus syndrome   . Tachy-brady syndrome     s/p Guidant dual-chamber pacemaker  . Atrial fibrillation     Permanent; off Coumadin secondary to GI bleed  . History of echocardiogram     Preserved LV function  . Valvular heart disease     moderate mitral regurgitation  . Chronic anemia     Followed by Dr. Butler, hemoglobin 9.9 (7/10) white count 15.2  . Dyslipidemia   . OSA on CPAP   . History  of hospitalization due to heart failure 12/11    For acute and chronic diastoic heart failure and right-sided heart failure symptoms/volume overload  . Orthostatic hypotension   . GI bleeding     Coumadin discontinued  . Pacemaker   . Hypertension   . Syncope and collapse   . CHF (congestive heart failure)     Past Surgical History  Procedure Date  . Pcm 03/26/07    Guidant INSIGNIA-1 - Tachybrady syndrome & A Fib    Family History  Problem Relation Age of Onset  . Coronary artery disease Neg Hx   . Diabetes Neg Hx   . Hypertension Neg Hx     History   Social History  . Marital Status: Married    Spouse Name: N/A    Number of Children: N/A  . Years of Education: N/A   Occupational History  . Retired    Social History Main Topics  . Smoking status: Former Smoker -- 2.0 packs/day for 30 years    Types: Cigarettes    Quit date: 10/08/1970  . Smokeless tobacco: Never Used   Comment: Tobacco use-no  . Alcohol Use: No  . Drug Use: No  . Sexually Active: Not on file   Other Topics Concern  . Not on file   Social History Narrative  . No narrative on file    Current Outpatient Prescriptions   Medication  Sig  Dispense  Refill   .  albuterol (  PROVENTIL) (2.5 MG/3ML) 0.083% nebulizer solution  Take 2.5 mg by nebulization as needed.     .  aspirin 81 MG EC tablet  Take 81 mg by mouth daily.     .  Calcium Carbonate-Vit D-Min 600-400 MG-UNIT TABS  Take 1 tablet by mouth daily.     .  carbidopa-levodopa (SINEMET) 10-100 MG per tablet  Take 1 tablet by mouth 2 (two) times daily.     .  carvedilol (COREG) 6.25 MG tablet  Take 6.25 mg by mouth 2 (two) times daily.     .  colchicine 0.6 MG tablet  Take 0.6 mg by mouth daily.     .  donepezil (ARICEPT) 10 MG tablet  Take 10 mg by mouth daily.     .  FLUoxetine (PROZAC) 40 MG capsule  Take 40 mg by mouth daily.     .  gabapentin (NEURONTIN) 300 MG capsule  Take 300 mg by mouth at bedtime.     .  glipiZIDE (GLUCOTROL) 5 MG tablet   Take 5 mg by mouth 2 (two) times daily.     .  insulin glargine (LANTUS SOLOSTAR) 100 UNIT/ML injection  Inject into the skin as directed.     .  LORazepam (ATIVAN) 0.5 MG tablet  Take 0.5 mg by mouth 2 (two) times daily as needed.     .  memantine (NAMENDA) 10 MG tablet  Take 10 mg by mouth 2 (two) times daily.     .  metolazone (ZAROXOLYN) 2.5 MG tablet  Take 2.5 mg by mouth daily.     .  ondansetron (ZOFRAN) 4 MG tablet  Take 4 mg by mouth daily as needed. For nausea.     .  OXYGEN-HELIUM IN  Inhale 2-4 L into the lungs as directed.     .  potassium chloride SA (K-DUR,KLOR-CON) 20 MEQ tablet  Take 20 mEq by mouth 2 (two) times daily.     .  simvastatin (ZOCOR) 40 MG tablet  Take 20 mg by mouth at bedtime.     .  torsemide (DEMADEX) 20 MG tablet  Take 20 mg by mouth daily. Alternate with 40mg qod     .  verapamil (CALAN-SR) 120 MG CR tablet  Take 1 tablet (120 mg total) by mouth at bedtime.  30 tablet  6       Allergies  Allergen Reactions  . Allopurinol   . Penicillins     ROS:  General: no fevers/chills/night sweats, poor energy Eyes: no blurry vision, diplopia, or amaurosis ENT: no sore throat or hearing loss Resp: no cough or hemoptysis CV: no edema or palpitations GI: no abdominal pain, nausea, vomiting, diarrhea, or constipation GU: no dysuria, frequency, or hematuria Skin: no rash Neuro: no headache, numbness, tingling, or weakness of extremities Musculoskeletal: positive for hip and low back pain, leg weakness Heme: no bleeding, DVT, positive for easy bruising Endo: no polydipsia or polyuria  BP 121/72  Pulse 77  Resp 21  Ht 6' 1" (1.854 m)  Wt 94.802 kg (209 lb)  BMI 27.57 kg/m2  SpO2 94%  PHYSICAL EXAM: Pt is alert and oriented, elderly, frail man in no distress. HEENT: normal Neck: JVP normal. Carotid upstrokes normal with bilateral bruits. No thyromegaly. Lungs: equal expansion, clear bilaterally CV: Apex is discrete and nondisplaced, RRR with grade 3/6  systolic crescendo decresendo murmur heard a tthe LSB and RUSB, no diastolic murmur or gallop Abd: soft, NT, +BS, no bruit, no hepatosplenomegaly Back: no   CVA tenderness Ext: no C/C/E        DP/PT pulses intact and = Skin: warm and dry without rash Neuro: CNII-XII intact             Strength intact = bilaterally  EKG:  V paced 60 bpm  2D ECHO: 03/18/2012 - personally reviewed. LVEF 65% with normal wall motion, severe AS with peak and mean gradients of 59 and 33mmHg, respectively. AVA 0.99 square cm. Moderate MR and TR, Mild AR, and severe pulmonary HTN.  Pulmonary Function Tests  Baseline  FVC 2.35 L (55% predicted)  FEV1 1.58 L (48% predicted)  FEF25-75 1.01 L (33% predicted)  Post-Brochodilator  Pulmonary Function Tests  FVC 2.49 L (58% predicted)  FEV1 1.71 L (52% predicted)  FEF25-75 1.01 L (33% predicted)   6 Minute Walk Test Results  Patient: [name]  Date: 05/01/2012  Supplemental O2 during test? On 4L of O2 the entire walk, no extra oxygen needed.  Baseline End  Time 13:47 13:53  Heartrate 77 91  Dyspnea mild moderate  Fatigue moderate moderate  O2 sat 94% 98%  Blood pressure 121/72 125/65  Patient ambulated at a slow pace for a total distance of 425 feet with 4 stops.  Ambulation was limited primarily due to being very short of breath and weak, used a walker to walk.  Overall the test was tolerated well.   STS Risk Calculator  Procedure AVR  Risk of Mortality 9.414%  Morbidity or Mortality 39.606%  Prolonged LOS 24.494%  Short LOS 8.744%  Permanent Stroke 1.974%  Prolonged Vent Support 28.346%  DSW Infection 0.659%  Renal Failure 17.901%  Reoperation 12.076%  ASSESSMENT AND PLAN:  78 year-old man with severe but not critical aortic stenosis. He is symptomatic with NYHA class III symptoms. He has multiple comorbidities as outlined above, most notably severe COPD and pulmonary HTN. He also has a clear component of 'frailty,' which is not accounted for in  traditional risk calculators. I agree with Dr Owen that he is not a candidate for conventional open aortic valve replacement. The question is whether he may be a candidate for transcatheter aortic valve replacement.   The patient was seen concurrently with Dr Owen, and we reviewed the pathophysiology, prognosis, and treatment options associated with severe aortic stenosis in detail with the patient and his wife. If they decide to pursue therapy, the next step would be to undergo right and left heart catheterization. This would allow for assessment of intracardiac hemodynamics and coronary anatomy. We reviewed the risks, indications, and alternatives to diagnostic right and left heart cath. They understand and would like to think about whether to pursue further therapy. We will follow-up with their decision within the next few weeks.  Depending on the findings at cardiac cath, I think the patient could be considered for balloon valvuloplasty or TAVR and these options will be better defined once his cath is done.  [name] 05/01/2012 11:44 PM           

## 2012-05-20 NOTE — Progress Notes (Signed)
Bedrest begins @ 1000.  Tegaderm dressing applied to right groin site by Venda Rodes.  Right groin site level 0.  Dr. Excell Seltzer in to discuss results with patient and family.

## 2012-06-10 ENCOUNTER — Ambulatory Visit: Payer: Medicare Other | Admitting: Cardiology

## 2012-06-10 ENCOUNTER — Ambulatory Visit (INDEPENDENT_AMBULATORY_CARE_PROVIDER_SITE_OTHER): Payer: Medicare Other | Admitting: Physician Assistant

## 2012-06-10 ENCOUNTER — Encounter: Payer: Self-pay | Admitting: Physician Assistant

## 2012-06-10 VITALS — BP 108/65 | HR 66 | Ht 73.0 in | Wt 211.0 lb

## 2012-06-10 DIAGNOSIS — I2789 Other specified pulmonary heart diseases: Secondary | ICD-10-CM

## 2012-06-10 DIAGNOSIS — N189 Chronic kidney disease, unspecified: Secondary | ICD-10-CM

## 2012-06-10 DIAGNOSIS — I350 Nonrheumatic aortic (valve) stenosis: Secondary | ICD-10-CM

## 2012-06-10 DIAGNOSIS — I359 Nonrheumatic aortic valve disorder, unspecified: Secondary | ICD-10-CM

## 2012-06-10 DIAGNOSIS — I5032 Chronic diastolic (congestive) heart failure: Secondary | ICD-10-CM

## 2012-06-10 DIAGNOSIS — I4891 Unspecified atrial fibrillation: Secondary | ICD-10-CM

## 2012-06-10 NOTE — Progress Notes (Signed)
Primary Cardiologist: Lewayne Bunting, MD   HPI: Patient presents for scheduled followup, and status post recent elective R./L. cardiac catheterization, by Dr. Excell Seltzer, for evaluation of severe aortic stenosis. Study yielded minor nonobstructive CAD, mild PHTN, and moderate AS (LVG deferred secondary to CKD). However, Dr. Excell Seltzer qualified this finding that when considering recent echo results, he suspected that the AS may be borderline moderately severe. Recommended 2-D echocardiogram followup in 6 months, as well as followup in the multidisciplinary valve clinic.  Clinically, patient denies any worsening of baseline exertional dyspnea. He is on continuous supplemental oxygen. Denies complications of the right groin incision site.  Allergies  Allergen Reactions  . Allopurinol   . Penicillins     Current Outpatient Prescriptions  Medication Sig Dispense Refill  . albuterol (PROVENTIL) (2.5 MG/3ML) 0.083% nebulizer solution Take 2.5 mg by nebulization as needed.        Marland Kitchen aspirin 81 MG EC tablet Take 81 mg by mouth daily.        . Calcium Carbonate-Vit D-Min 600-400 MG-UNIT TABS Take 1 tablet by mouth daily.        . carbidopa-levodopa (SINEMET) 10-100 MG per tablet Take 1 tablet by mouth 2 (two) times daily.        . carvedilol (COREG) 6.25 MG tablet Take 6.25 mg by mouth 2 (two) times daily.        . colchicine 0.6 MG tablet Take 0.6 mg by mouth daily.        Marland Kitchen donepezil (ARICEPT) 10 MG tablet Take 10 mg by mouth daily.        Marland Kitchen FLUoxetine (PROZAC) 40 MG capsule Take 40 mg by mouth daily.      Marland Kitchen gabapentin (NEURONTIN) 300 MG capsule Take 300 mg by mouth at bedtime.        Marland Kitchen glipiZIDE (GLUCOTROL) 5 MG tablet Take 5 mg by mouth 2 (two) times daily.        . insulin glargine (LANTUS SOLOSTAR) 100 UNIT/ML injection Inject into the skin as directed.        Marland Kitchen LORazepam (ATIVAN) 0.5 MG tablet Take 0.5 mg by mouth 2 (two) times daily as needed.        . memantine (NAMENDA) 10 MG tablet Take 10 mg by  mouth 2 (two) times daily.        . metolazone (ZAROXOLYN) 2.5 MG tablet Take 2.5 mg by mouth daily.      . ondansetron (ZOFRAN) 4 MG tablet Take 4 mg by mouth daily as needed. For nausea.       . OXYGEN-HELIUM IN Inhale 2-4 L into the lungs as directed.        . potassium chloride SA (K-DUR,KLOR-CON) 20 MEQ tablet Take 20 mEq by mouth 2 (two) times daily.       . simvastatin (ZOCOR) 40 MG tablet Take 20 mg by mouth at bedtime.        . torsemide (DEMADEX) 20 MG tablet Take 20 mg by mouth daily. Alternate with 40mg  qod      . verapamil (CALAN-SR) 120 MG CR tablet Take 1 tablet (120 mg total) by mouth at bedtime.  30 tablet  6    Past Medical History  Diagnosis Date  . Aortic stenosis     Severe aortic stenosis mean pressure gradient 33 mm of mercury  . Chronic diastolic heart failure     Multiple hospital admissions for  . Mixed hyperlipidemia   . Dementia   . Diabetes mellitus  Insulin-dependent  . Pulmonary hypertension     Severe, oxygen dependent#65-70 mmHg most recently by echocardiogram December 2012.  . Sick sinus syndrome   . Tachy-brady syndrome     s/p Guidant dual-chamber pacemaker  . Atrial fibrillation     Permanent; off Coumadin secondary to GI bleed  . History of echocardiogram     Preserved LV function  . Valvular heart disease     moderate mitral regurgitation  . Chronic anemia     Followed by Dr. Charm Barges, hemoglobin 9.9 (7/10) white count 15.2  . Dyslipidemia   . OSA on CPAP   . History of hospitalization due to heart failure 12/11    For acute and chronic diastoic heart failure and right-sided heart failure symptoms/volume overload  . Orthostatic hypotension   . GI bleeding     Coumadin discontinued  . Pacemaker   . Hypertension   . Syncope and collapse   . CHF (congestive heart failure)     Past Surgical History  Procedure Date  . Pcm 03/26/07    Guidant INSIGNIA-1 - Tachybrady syndrome & A Fib  . Cardiac catheterization 05-20-12    left and  right heart cath with coronary angiogram    History   Social History  . Marital Status: Married    Spouse Name: N/A    Number of Children: N/A  . Years of Education: N/A   Occupational History  . Retired    Social History Main Topics  . Smoking status: Former Smoker -- 2.0 packs/day for 30 years    Types: Cigarettes    Quit date: 10/08/1970  . Smokeless tobacco: Never Used   Comment: Tobacco use-no  . Alcohol Use: No  . Drug Use: No  . Sexually Active: Not on file   Other Topics Concern  . Not on file   Social History Narrative  . No narrative on file    Family History  Problem Relation Age of Onset  . Coronary artery disease Neg Hx   . Diabetes Neg Hx   . Hypertension Neg Hx     ROS: no nausea, vomiting; no fever, chills; no melena, hematochezia; no claudication  PHYSICAL EXAM: BP 108/65  Pulse 66  Ht 6\' 1"  (1.854 m)  Wt 211 lb (95.709 kg)  BMI 27.84 kg/m2 GENERAL: 78 year-old male, chronically ill appearing, wearing nasal cannula; NAD HEENT: NCAT, PERRLA, EOMI; sclera clear; no xanthelasma NECK: palpable bilateral carotid pulses, no bruits; no JVD; no TM LUNGS: CTA bilaterally CARDIAC: Irregularly irregular (S1); 3/6 crescendo murmur, inaudible S2 ABDOMEN: soft, non-tender; intact BS EXTREMETIES: no significant peripheral edema SKIN: warm/dry; no obvious rash/lesions MUSCULOSKELETAL: no joint deformity NEURO: no focal deficit; NL affect   EKG:    ASSESSMENT & PLAN:  Aortic stenosis Results of recent right heart catheterization reviewed with patient. Assessed as moderate AS, by catheterization. Will order a surveillance echocardiogram in approximately 6 months, prior to next OV with Dr. Andee Lineman.  ATRIAL FIBRILLATION Rate controlled on current medication regimen. Previously deemed not suitable Coumadin candidate, secondary history of GIB. Continue low-dose aspirin.  Chronic renal failure Will check followup BMET, in light of recent  catheterization.  DIASTOLIC HEART FAILURE, CHRONIC Euvolemic by history and exam. Patient is down 4 pounds, since last OV. Continue current diuretic regimen.  HYPERTENSION, PULMONARY On continuous supplemental oxygen, followed by Dr. Sonia Baller, PAC

## 2012-06-10 NOTE — Assessment & Plan Note (Signed)
Rate controlled on current medication regimen. Previously deemed not suitable Coumadin candidate, secondary history of GIB. Continue low-dose aspirin.

## 2012-06-10 NOTE — Patient Instructions (Signed)
   Lab:  BMET  Office will contact with results Your physician wants you to follow up in: 6 months.  You will receive a reminder letter in the mail one-two months in advance.  If you don't receive a letter, please call our office to schedule the follow up appointment  Echo - just before next office visit

## 2012-06-10 NOTE — Assessment & Plan Note (Signed)
Will check followup BMET, in light of recent catheterization.

## 2012-06-10 NOTE — Assessment & Plan Note (Signed)
Euvolemic by history and exam. Patient is down 4 pounds, since last OV. Continue current diuretic regimen.

## 2012-06-10 NOTE — Assessment & Plan Note (Signed)
On continuous supplemental oxygen, followed by Dr. Cherie Ouch

## 2012-06-10 NOTE — Assessment & Plan Note (Signed)
Results of recent right heart catheterization reviewed with patient. Assessed as moderate AS, by catheterization. Will order a surveillance echocardiogram in approximately 6 months, prior to next OV with Dr. Andee Lineman.

## 2012-06-12 ENCOUNTER — Telehealth: Payer: Self-pay | Admitting: *Deleted

## 2012-06-12 ENCOUNTER — Ambulatory Visit: Payer: Medicare Other | Admitting: Cardiology

## 2012-06-12 DIAGNOSIS — R7989 Other specified abnormal findings of blood chemistry: Secondary | ICD-10-CM

## 2012-06-12 DIAGNOSIS — R0602 Shortness of breath: Secondary | ICD-10-CM

## 2012-06-12 NOTE — Telephone Encounter (Signed)
Notes Recorded by Lesle Chris, LPN on 10/12/1094 at 1:32 PM Left message to return call.

## 2012-06-12 NOTE — Telephone Encounter (Signed)
Message copied by Lesle Chris on Fri Jun 12, 2012  1:32 PM ------      Message from: Prescott Parma C      Created: Thu Jun 11, 2012  5:45 PM       Hold all diuretics for 24 hrs, but continue K+ supplement. BMET/BNP level in 5 days

## 2012-06-12 NOTE — Telephone Encounter (Signed)
Notes Recorded by Lesle Chris, LPN on 06/12/453 at 1:43 PM Wife (Opal) notified of below. Will fax order to [facility] now. Will do on 9/11 or 06/18/2012

## 2012-06-22 ENCOUNTER — Telehealth: Payer: Self-pay | Admitting: *Deleted

## 2012-06-22 DIAGNOSIS — Z79899 Other long term (current) drug therapy: Secondary | ICD-10-CM

## 2012-06-22 DIAGNOSIS — R0602 Shortness of breath: Secondary | ICD-10-CM

## 2012-06-22 DIAGNOSIS — I10 Essential (primary) hypertension: Secondary | ICD-10-CM

## 2012-06-22 DIAGNOSIS — R7989 Other specified abnormal findings of blood chemistry: Secondary | ICD-10-CM

## 2012-06-22 MED ORDER — TORSEMIDE 20 MG PO TABS: 10.0000 mg | ORAL_TABLET | Freq: Every day | ORAL | Status: AC

## 2012-06-22 NOTE — Telephone Encounter (Signed)
Notes Recorded by Lesle Chris, LPN on 1/61/0960 at 2:04 PM Wife (Opal) notified of below. Will fax order over to [facility] today. Will do around 9/24 or 9/25.

## 2012-06-22 NOTE — Telephone Encounter (Signed)
Message copied by Lesle Chris on Mon Jun 22, 2012  2:04 PM ------      Message from: Rande Brunt      Created: Fri Jun 19, 2012  6:40 PM       BUN down, creatinine stable at 1.6. BNP 200. Recommend resuming Demadex at reduced dose of 10 mg daily. Continue supplemental K. Repeat BMET/BNP in 1 week.

## 2012-06-26 ENCOUNTER — Telehealth: Payer: Self-pay | Admitting: *Deleted

## 2012-06-26 NOTE — Telephone Encounter (Signed)
Wife (Opal) notified.

## 2012-06-26 NOTE — Telephone Encounter (Signed)
Continue monitoring clinically with daily weights, low Na diet

## 2012-06-26 NOTE — Telephone Encounter (Signed)
Wife (Opal) - patient gaining weight - has gained about 2 pounds since yesterday.  No change in breathing, but does notice some edema around abdomen.  Not due for repeat labs till next Tuesday or Wednesday.  Please advise.

## 2012-07-02 ENCOUNTER — Telehealth: Payer: Self-pay | Admitting: *Deleted

## 2012-07-02 NOTE — Telephone Encounter (Signed)
Notes Recorded by Lesle Chris, LPN on 10/25/1476 at 9:54 AM Wife (Opal) notified. Notes Recorded by Lesle Chris, LPN on 2/95/6213 at 9:51 AM Left message to return call.

## 2012-07-02 NOTE — Telephone Encounter (Signed)
Message copied by Lesle Chris on Thu Jul 02, 2012  9:54 AM ------      Message from: Prescott Parma C      Created: Thu Jul 02, 2012  8:33 AM       Renal fxn improved, but BNP persistently elevated around 200-240 range. Reassess clinical status at next OV

## 2012-07-08 ENCOUNTER — Telehealth: Payer: Self-pay | Admitting: *Deleted

## 2012-07-08 ENCOUNTER — Encounter: Payer: Self-pay | Admitting: Internal Medicine

## 2012-07-08 ENCOUNTER — Ambulatory Visit (INDEPENDENT_AMBULATORY_CARE_PROVIDER_SITE_OTHER): Payer: Medicare Other | Admitting: Internal Medicine

## 2012-07-08 VITALS — BP 119/73 | HR 77 | Ht 73.0 in | Wt 218.0 lb

## 2012-07-08 DIAGNOSIS — I2789 Other specified pulmonary heart diseases: Secondary | ICD-10-CM

## 2012-07-08 DIAGNOSIS — I359 Nonrheumatic aortic valve disorder, unspecified: Secondary | ICD-10-CM

## 2012-07-08 DIAGNOSIS — I4891 Unspecified atrial fibrillation: Secondary | ICD-10-CM

## 2012-07-08 DIAGNOSIS — R0602 Shortness of breath: Secondary | ICD-10-CM

## 2012-07-08 DIAGNOSIS — I5032 Chronic diastolic (congestive) heart failure: Secondary | ICD-10-CM

## 2012-07-08 DIAGNOSIS — I272 Pulmonary hypertension, unspecified: Secondary | ICD-10-CM

## 2012-07-08 NOTE — Assessment & Plan Note (Signed)
I suspect that this is mostly fluid overload as noted above.

## 2012-07-08 NOTE — Assessment & Plan Note (Signed)
Aortic stenosis is moderate; there is no obstructive coronary disease.

## 2012-07-08 NOTE — Progress Notes (Signed)
kf Patient Care Team: Samuel Jester, DO as PCP - General June Leap, MD as Referring Physician (Cardiology)   HPI  [name] is a 78 y.o. male Seen as add on today because of Decreasing O2 sats following a fall on Saturday where he hit his head. He has had some residual back pain.  He has aortic stenosis with a mean gradient of 33 felt to be moderate. He was not felt to be an operative candidate at this time. Catheterization demonstrated no obstructive coronary disease. He does have pulmonary hypertension and oxygen treated COPD. His baseline status typically run in the 90s. Over the last 48 hours they have fallen through the 80s into the low 70s.  He denies fever or cough. There has been no pleuritic pain and no chest discomfort. He is mild edema.  Past Medical History  Diagnosis Date  . Aortic stenosis     Severe aortic stenosis mean pressure gradient 33 mm of mercury  . Chronic diastolic heart failure     Multiple hospital admissions for  . Mixed hyperlipidemia   . Dementia   . Diabetes mellitus     Insulin-dependent  . Pulmonary hypertension     Severe, oxygen dependent#65-70 mmHg most recently by echocardiogram December 2012.  . Sick sinus syndrome   . Tachy-brady syndrome     s/p Guidant dual-chamber pacemaker  . Atrial fibrillation     Permanent; off Coumadin secondary to GI bleed  . History of echocardiogram     Preserved LV function  . Valvular heart disease     moderate mitral regurgitation  . Chronic anemia     Followed by Dr. Charm Barges, hemoglobin 9.9 (7/10) white count 15.2  . Dyslipidemia   . OSA on CPAP   . History of hospitalization due to heart failure 12/11    For acute and chronic diastoic heart failure and right-sided heart failure symptoms/volume overload  . Orthostatic hypotension   . GI bleeding     Coumadin discontinued  . Pacemaker   . Hypertension   . Syncope and collapse   . CHF (congestive heart failure)     Past Surgical History    Procedure Date  . Pcm 03/26/07    Guidant INSIGNIA-1 - Tachybrady syndrome & A Fib  . Cardiac catheterization 05-20-12    left and right heart cath with coronary angiogram    Current Outpatient Prescriptions  Medication Sig Dispense Refill  . albuterol (PROVENTIL) (2.5 MG/3ML) 0.083% nebulizer solution Take 2.5 mg by nebulization as needed.        Marland Kitchen aspirin 81 MG EC tablet Take 81 mg by mouth daily.        . Calcium Carbonate-Vit D-Min 600-400 MG-UNIT TABS Take 1 tablet by mouth daily.        . carbidopa-levodopa (SINEMET) 10-100 MG per tablet Take 1 tablet by mouth 2 (two) times daily.        . carvedilol (COREG) 6.25 MG tablet Take 6.25 mg by mouth 2 (two) times daily.        . colchicine 0.6 MG tablet Take 0.6 mg by mouth daily.        Marland Kitchen donepezil (ARICEPT) 10 MG tablet Take 10 mg by mouth daily.        Marland Kitchen FLUoxetine (PROZAC) 40 MG capsule Take 40 mg by mouth daily.      Marland Kitchen gabapentin (NEURONTIN) 300 MG capsule Take 300 mg by mouth at bedtime.        Marland Kitchen  glipiZIDE (GLUCOTROL) 5 MG tablet Take 5 mg by mouth 2 (two) times daily.        . insulin glargine (LANTUS SOLOSTAR) 100 UNIT/ML injection Inject into the skin as directed.        Marland Kitchen LORazepam (ATIVAN) 0.5 MG tablet Take 0.5 mg by mouth 2 (two) times daily as needed.        . memantine (NAMENDA) 10 MG tablet Take 10 mg by mouth 2 (two) times daily.        . metolazone (ZAROXOLYN) 2.5 MG tablet Take 2.5 mg by mouth daily.      . ondansetron (ZOFRAN) 4 MG tablet Take 4 mg by mouth daily as needed. For nausea.       . OXYGEN-HELIUM IN Inhale 2-4 L into the lungs as directed.        . potassium chloride SA (K-DUR,KLOR-CON) 20 MEQ tablet Take 20 mEq by mouth 2 (two) times daily.       . simvastatin (ZOCOR) 40 MG tablet Take 20 mg by mouth at bedtime.        . torsemide (DEMADEX) 20 MG tablet Take 0.5 tablets (10 mg total) by mouth daily.      . verapamil (CALAN-SR) 120 MG CR tablet Take 1 tablet (120 mg total) by mouth at bedtime.  30 tablet  6     Allergies  Allergen Reactions  . Allopurinol   . Penicillins     Review of Systems negative except from HPI and PMH  Physical Exam BP 119/73  Pulse 77  Ht 6\' 1"  (1.854 m)  Wt 218 lb (98.884 kg)  BMI 28.76 kg/m2  SpO2 71% Well developed and well nourished in no acute distress HENT normal E scleral and icterus clear Neck Supple JVP to the angle of the jaw; carotids parvus et tardus  Clear bibasilar crackles   Irregularly irregular rate which is relatively well controlled there is a harsh 3/6 systolic murmur with a single S2 gallops or rub Soft with active bowel sounds No clubbing cyanosis 1+ Edema Alert and oriented, grossly normal motor and sensory function Skin Warm and Dry    Assessment and  Plan

## 2012-07-08 NOTE — Assessment & Plan Note (Signed)
As above.

## 2012-07-08 NOTE — Telephone Encounter (Addendum)
Wife (Opal) calling - weight gain of about 5 lbs since yesterday morning.  Stomach feeling real full / edema.  BP - 159/88 this morning.  States he is on 4L/min all the time.  Sat this morning was 82.  Normally runs 96 - 97 with oxygen on.    Will have patient come in this evening at 2:45 to see Dr. Graciela Husbands.

## 2012-07-08 NOTE — Patient Instructions (Signed)
Going to ED for evaluation 

## 2012-07-08 NOTE — Assessment & Plan Note (Signed)
He has significant shortness of breath with deteriorating O2 sats. His weight is up 7 pounds suggesting congestive heart failure. With his fall and his sedentary nature, I am also concerned about pulmonary embolism. We will send him to the emergency room for assessment of d-dimer as well as intravenous Lasix. His renal function will need to be assessed

## 2012-07-08 NOTE — Assessment & Plan Note (Signed)
Permanent and reasonably well rate controlled

## 2012-07-15 DIAGNOSIS — I495 Sick sinus syndrome: Secondary | ICD-10-CM

## 2012-07-26 ENCOUNTER — Other Ambulatory Visit: Payer: Self-pay | Admitting: Cardiology

## 2012-10-14 ENCOUNTER — Encounter: Payer: Self-pay | Admitting: Internal Medicine

## 2012-10-14 DIAGNOSIS — I495 Sick sinus syndrome: Secondary | ICD-10-CM

## 2013-01-13 DIAGNOSIS — I495 Sick sinus syndrome: Secondary | ICD-10-CM

## 2013-01-25 ENCOUNTER — Encounter (HOSPITAL_COMMUNITY): Payer: Self-pay | Admitting: *Deleted

## 2013-04-14 ENCOUNTER — Encounter: Payer: Self-pay | Admitting: Internal Medicine

## 2013-04-14 DIAGNOSIS — I495 Sick sinus syndrome: Secondary | ICD-10-CM

## 2013-04-28 ENCOUNTER — Encounter: Payer: Self-pay | Admitting: *Deleted

## 2013-06-10 ENCOUNTER — Other Ambulatory Visit: Payer: Self-pay | Admitting: Physician Assistant

## 2013-06-14 ENCOUNTER — Telehealth: Payer: Self-pay | Admitting: *Deleted

## 2013-07-08 ENCOUNTER — Ambulatory Visit: Payer: Medicare Other | Admitting: Internal Medicine

## 2013-07-14 DIAGNOSIS — I495 Sick sinus syndrome: Secondary | ICD-10-CM

## 2013-08-17 ENCOUNTER — Encounter: Payer: Self-pay | Admitting: Internal Medicine

## 2013-09-06 ENCOUNTER — Encounter: Payer: Self-pay | Admitting: Internal Medicine

## 2013-10-25 ENCOUNTER — Telehealth: Payer: Self-pay

## 2013-10-25 NOTE — Telephone Encounter (Signed)
Patient past away @ Home per Obituary in [place] [employer] °

## 2013-11-07 DEATH — deceased
